# Patient Record
Sex: Female | Born: 1955 | Race: White | Hispanic: No | Marital: Married | Smoking: Never smoker
Health system: Southern US, Community
[De-identification: ages and names within clinical notes are randomized; demographics above are authoritative.]

## PROBLEM LIST (undated history)

## (undated) DIAGNOSIS — K219 Gastro-esophageal reflux disease without esophagitis: Secondary | ICD-10-CM

## (undated) DIAGNOSIS — IMO0002 Reserved for concepts with insufficient information to code with codable children: Secondary | ICD-10-CM

## (undated) DIAGNOSIS — A048 Other specified bacterial intestinal infections: Secondary | ICD-10-CM

## (undated) DIAGNOSIS — K259 Gastric ulcer, unspecified as acute or chronic, without hemorrhage or perforation: Secondary | ICD-10-CM

## (undated) DIAGNOSIS — R7689 Other specified abnormal immunological findings in serum: Secondary | ICD-10-CM

## (undated) DIAGNOSIS — R768 Other specified abnormal immunological findings in serum: Secondary | ICD-10-CM

## (undated) DIAGNOSIS — N200 Calculus of kidney: Secondary | ICD-10-CM

## (undated) DIAGNOSIS — F32A Depression, unspecified: Secondary | ICD-10-CM

## (undated) DIAGNOSIS — D126 Benign neoplasm of colon, unspecified: Secondary | ICD-10-CM

## (undated) DIAGNOSIS — E559 Vitamin D deficiency, unspecified: Secondary | ICD-10-CM

## (undated) DIAGNOSIS — M3500 Sicca syndrome, unspecified: Secondary | ICD-10-CM

## (undated) DIAGNOSIS — F329 Major depressive disorder, single episode, unspecified: Secondary | ICD-10-CM

## (undated) DIAGNOSIS — M8580 Other specified disorders of bone density and structure, unspecified site: Secondary | ICD-10-CM

## (undated) DIAGNOSIS — K7689 Other specified diseases of liver: Secondary | ICD-10-CM

## (undated) HISTORY — PX: TUBAL LIGATION: SHX77

## (undated) HISTORY — DX: Reserved for concepts with insufficient information to code with codable children: IMO0002

## (undated) HISTORY — DX: Other specified disorders of bone density and structure, unspecified site: M85.80

## (undated) HISTORY — DX: Calculus of kidney: N20.0

## (undated) HISTORY — DX: Major depressive disorder, single episode, unspecified: F32.9

## (undated) HISTORY — DX: Gastro-esophageal reflux disease without esophagitis: K21.9

## (undated) HISTORY — PX: UMBILICAL HERNIA REPAIR: SHX2598

## (undated) HISTORY — DX: Other specified abnormal immunological findings in serum: R76.8

## (undated) HISTORY — DX: Other specified abnormal immunological findings in serum: R76.89

## (undated) HISTORY — PX: VAGINAL HYSTERECTOMY: SUR661

## (undated) HISTORY — PX: BREAST EXCISIONAL BIOPSY: SUR124

## (undated) HISTORY — DX: Depression, unspecified: F32.A

## (undated) HISTORY — DX: Sjogren syndrome, unspecified: M35.00

## (undated) HISTORY — PX: DILATION AND CURETTAGE OF UTERUS: SHX78

## (undated) HISTORY — DX: Other specified diseases of liver: K76.89

## (undated) HISTORY — DX: Gastric ulcer, unspecified as acute or chronic, without hemorrhage or perforation: K25.9

## (undated) HISTORY — DX: Other specified bacterial intestinal infections: A04.8

## (undated) HISTORY — DX: Vitamin D deficiency, unspecified: E55.9

## (undated) HISTORY — DX: Benign neoplasm of colon, unspecified: D12.6

## (undated) HISTORY — PX: BREAST SURGERY: SHX581

---

## 1998-06-30 ENCOUNTER — Ambulatory Visit (HOSPITAL_COMMUNITY): Admission: RE | Admit: 1998-06-30 | Discharge: 1998-06-30 | Payer: Self-pay | Admitting: Neurological Surgery

## 1998-06-30 ENCOUNTER — Encounter: Payer: Self-pay | Admitting: Neurological Surgery

## 1998-12-03 ENCOUNTER — Other Ambulatory Visit: Admission: RE | Admit: 1998-12-03 | Discharge: 1998-12-03 | Payer: Self-pay | Admitting: Gynecology

## 2001-03-14 ENCOUNTER — Other Ambulatory Visit: Admission: RE | Admit: 2001-03-14 | Discharge: 2001-03-14 | Payer: Self-pay | Admitting: Gynecology

## 2003-07-25 ENCOUNTER — Other Ambulatory Visit: Admission: RE | Admit: 2003-07-25 | Discharge: 2003-07-25 | Payer: Self-pay | Admitting: Gynecology

## 2006-11-24 ENCOUNTER — Other Ambulatory Visit: Admission: RE | Admit: 2006-11-24 | Discharge: 2006-11-24 | Payer: Self-pay | Admitting: Gynecology

## 2007-02-01 HISTORY — PX: OTHER SURGICAL HISTORY: SHX169

## 2007-08-29 ENCOUNTER — Observation Stay (HOSPITAL_COMMUNITY): Admission: EM | Admit: 2007-08-29 | Discharge: 2007-08-30 | Payer: Self-pay | Admitting: Urology

## 2008-08-11 ENCOUNTER — Ambulatory Visit: Payer: Self-pay | Admitting: Gynecology

## 2008-08-21 ENCOUNTER — Ambulatory Visit: Payer: Self-pay | Admitting: Gynecology

## 2008-08-21 ENCOUNTER — Other Ambulatory Visit: Admission: RE | Admit: 2008-08-21 | Discharge: 2008-08-21 | Payer: Self-pay | Admitting: Gynecology

## 2008-08-21 ENCOUNTER — Encounter: Payer: Self-pay | Admitting: Gynecology

## 2010-06-15 NOTE — Op Note (Signed)
Teresa Christian, Teresa Christian           ACCOUNT NO.:  [id number]   MEDICAL RECORD NO.:  [id number]          PATIENT TYPE:  INP   LOCATION:  1410                         FACILITY:  [facility] [place]   PHYSICIAN:  Sigmund I. Patsi Sears, M.D.DATE OF BIRTH:  [birth year]   DATE OF PROCEDURE:  08/29/2007  DATE OF DISCHARGE:                               OPERATIVE REPORT   PREOPERATIVE DIAGNOSIS:  Impacted right lower ureteral calculus.   POSTOPERATIVE DIAGNOSIS:  Impacted right lower ureteral calculus.   OPERATION:  Cystourethroscopy, right retrograde pyelogram with  interpretation, balloon dilation of right lower ureter, right  ureteroscopy, basket extraction of right lower ureteral calculus, right  double-J stent (6-French x 24 cm).   SURGEON:  Sigmund I. Patsi Sears, M.D.   ANESTHESIA:  General LMA.   PREPARATION:  After appropriate preanesthesia, the patient was brought  to the operating room, placed on the operating room in dorsal supine  position where general LMA anesthesia was induced.  She was then  replaced in dorsal lithotomy position where pubis was prepped with  Betadine solution and draped in usual fashion.   REVIEW OF HISTORY:  The patient is a 55 year old female, with severe  acute onset right flank and right lower quadrant pain for several hours,  with nausea, chills, and but no vomiting.  She has no gross hematuria,  but does have a history of microscopic hematuria.  KUB showed a 3 mm  right lower ureteral calculus.  The patient had kidney stones 15 years  ago and spontaneously passed the stone with no problems.  Note her  history of mixed connective tissue disease as well.  She is now for  basket extraction of stone.  She has required copious amounts of IV pain  medication during the daytime.   PROCEDURE:  Cystourethroscopy accomplished, and a very tight small right  ureteral orifice was identified.  This would admit a 6-French open-ended  catheter and right retrograde pyelogram  was performed, stone was  identified in the right mid to lower ureter.  Hydronephrosis was noted  above the level of stone.  The short ureteroscope was placed in the  bladder, but could not be passed through the orifice into the ureter  without traumatizing the ureter.  Therefore a 4 cm balloon dilator was  used to pass across the ureteral orifice, was dilated for 3 minutes.  Following this, the balloon dilator was removed, and again the  ureteroscope was passed without difficulty into the mid ureter.  The  stone was identified, and basket extracted.  It appeared to be a  multilobulated, smooth, rounded stone.  Stone was taken to the office  and sent for evaluation.  The patient be given photographs of the stone.  Because of balloon dilation and severe spasm of the ureter, it was  elected to place double-J catheter.  A 6-French x 24-cm catheter was  then selected, passed over a guidewire into the kidney under  fluoroscopic table.  The patient tolerated well.  KUB was accomplished  to show that the double-J was in good position.  The patient was given  IV Toradol, awakened and taken recovery room in  good condition.      Sigmund I. Patsi Sears, M.D.  Electronically Signed     SIT/MEDQ  D:  08/29/2007  T:  08/30/2007  Job:  [id number]

## 2011-02-11 DIAGNOSIS — R768 Other specified abnormal immunological findings in serum: Secondary | ICD-10-CM | POA: Insufficient documentation

## 2011-02-11 DIAGNOSIS — IMO0002 Reserved for concepts with insufficient information to code with codable children: Secondary | ICD-10-CM | POA: Insufficient documentation

## 2011-02-11 DIAGNOSIS — N200 Calculus of kidney: Secondary | ICD-10-CM | POA: Insufficient documentation

## 2011-02-11 DIAGNOSIS — M8580 Other specified disorders of bone density and structure, unspecified site: Secondary | ICD-10-CM | POA: Insufficient documentation

## 2011-02-11 DIAGNOSIS — M3500 Sicca syndrome, unspecified: Secondary | ICD-10-CM | POA: Insufficient documentation

## 2011-02-14 ENCOUNTER — Other Ambulatory Visit: Payer: Self-pay | Admitting: Gynecology

## 2011-02-14 DIAGNOSIS — M8580 Other specified disorders of bone density and structure, unspecified site: Secondary | ICD-10-CM

## 2011-02-15 ENCOUNTER — Ambulatory Visit (INDEPENDENT_AMBULATORY_CARE_PROVIDER_SITE_OTHER): Payer: BC Managed Care – PPO

## 2011-02-15 ENCOUNTER — Encounter: Payer: Self-pay | Admitting: Gynecology

## 2011-02-15 ENCOUNTER — Ambulatory Visit (INDEPENDENT_AMBULATORY_CARE_PROVIDER_SITE_OTHER): Payer: BC Managed Care – PPO | Admitting: Gynecology

## 2011-02-15 VITALS — BP 116/64 | Ht 61.5 in | Wt 148.0 lb

## 2011-02-15 DIAGNOSIS — M899 Disorder of bone, unspecified: Secondary | ICD-10-CM

## 2011-02-15 DIAGNOSIS — M8580 Other specified disorders of bone density and structure, unspecified site: Secondary | ICD-10-CM

## 2011-02-15 DIAGNOSIS — Z01419 Encounter for gynecological examination (general) (routine) without abnormal findings: Secondary | ICD-10-CM

## 2011-02-15 DIAGNOSIS — Z79890 Hormone replacement therapy (postmenopausal): Secondary | ICD-10-CM

## 2011-02-15 MED ORDER — ESTRADIOL ACETATE 0.05 MG/24HR VA RING: 0.0500 | VAGINAL_RING | Freq: Once | VAGINAL | Status: DC

## 2011-02-15 NOTE — Patient Instructions (Signed)
Follow-up in one year.

## 2011-02-15 NOTE — Progress Notes (Signed)
Teresa Christian [birth year] [id number]        56 y.o.  for annual exam.  Doing well status post LAVH using Femring 0.05 mg.  Past medical history,surgical history, medications, allergies, family history and social history were all reviewed and documented in the EPIC chart. ROS:  Was performed and pertinent positives and negatives are included in the history.  Exam: chaperone present Filed Vitals:   02/15/11 0939  BP: 116/64   General appearance  Normal Skin grossly normal Head/Neck normal with no cervical or supraclavicular adenopathy thyroid normal Lungs  clear Cardiac RR, without RMG Abdominal  soft, nontender, without masses, organomegaly or hernia Breasts  examined lying and sitting without masses, retractions, discharge or axillary adenopathy. Pelvic  Ext/BUS/vagina  normal   Adnexa  Without masses or tenderness    Anus and perineum  normal   Rectovaginal  normal sphincter tone without palpated masses or tenderness.    Assessment/Plan:  56 y.o. female for annual exam.    1. ERT.  Patient using Femring 0.05 mg doing well with this wants to continue and I refilled her times a year. We have discussed on multiple occasions ERT and the risks to include the WHI study increased risk of stroke, heart attack, DVT and breast cancer risks. 2. Osteopenia. DEXA today shows osteopenia with a -2.0. In comparison to her prior studies there is no statistically significant change. FRAX shows 10 year fracture risk of major osteoporotic 8.5% and hip fracture of 1%.  She is off of Actonel. She'll continue with observation we'll plan on repeating DEXA in another several years. 3. Pap smear. Last Pap smear was 2010. She has multiple normals in her chart. She is status post LAVH for benign indications. Current screening guidelines were reviewed and she is comfortable with stopping Pap smears and no Pap smear was done today. 4. Mammography. Last mammography was April 2012 where she had a diagnostic  mammography and ultrasound because she had felt something behind her right nipple. Mammography was negative and ultrasound showed a small simple cyst. She notes what she felt before has disappeared and her breast exam today is normal. She'll continue with her mammography with repeat in April and with SBE monthly. 5. Colonoscopy. She had colonoscopy at age 45 and said she is due now she will call Dr. Loreta Ave to arrange. 6. Health maintenance.  She sees a rheumatologist at [facility] for her Sjogren's and Dr. Waynard Edwards for routine health maintenance and as he has an appointment to see him this coming month. No blood work was done a cystoscopy done through her other 2 physicians offices. Assuming she continues well from a gynecologic standpoint and she will see me in a year, sooner as needed    Dara Lords MD, 10:10 AM 02/15/2011

## 2011-05-31 ENCOUNTER — Other Ambulatory Visit: Payer: Self-pay | Admitting: Gastroenterology

## 2011-05-31 DIAGNOSIS — R1013 Epigastric pain: Secondary | ICD-10-CM

## 2011-05-31 DIAGNOSIS — R110 Nausea: Secondary | ICD-10-CM

## 2011-06-14 ENCOUNTER — Encounter (HOSPITAL_COMMUNITY)
Admission: RE | Admit: 2011-06-14 | Discharge: 2011-06-14 | Disposition: A | Discharge status: Home / Self Care | Payer: BC Managed Care – PPO | Source: Ambulatory Visit | Attending: Gastroenterology | Admitting: Gastroenterology

## 2011-06-14 DIAGNOSIS — R1013 Epigastric pain: Secondary | ICD-10-CM | POA: Insufficient documentation

## 2011-06-14 DIAGNOSIS — R110 Nausea: Secondary | ICD-10-CM | POA: Insufficient documentation

## 2011-06-14 MED ORDER — TECHNETIUM TC 99M MEBROFENIN IV KIT
5.5000 | PACK | Freq: Once | INTRAVENOUS | Status: AC | PRN
  Administered 2011-06-14: 6 via INTRAVENOUS

## 2011-07-02 DIAGNOSIS — D126 Benign neoplasm of colon, unspecified: Secondary | ICD-10-CM

## 2011-07-02 DIAGNOSIS — A048 Other specified bacterial intestinal infections: Secondary | ICD-10-CM

## 2011-07-02 DIAGNOSIS — K259 Gastric ulcer, unspecified as acute or chronic, without hemorrhage or perforation: Secondary | ICD-10-CM

## 2011-07-02 HISTORY — DX: Other specified bacterial intestinal infections: A04.8

## 2011-07-02 HISTORY — DX: Gastric ulcer, unspecified as acute or chronic, without hemorrhage or perforation: K25.9

## 2011-07-02 HISTORY — DX: Benign neoplasm of colon, unspecified: D12.6

## 2011-07-05 ENCOUNTER — Other Ambulatory Visit: Payer: Self-pay | Admitting: Gynecology

## 2011-07-13 ENCOUNTER — Other Ambulatory Visit: Payer: Self-pay | Admitting: Gynecology

## 2011-07-13 MED ORDER — FLUCONAZOLE 150 MG PO TABS: 150.0000 mg | ORAL_TABLET | Freq: Once | ORAL | Status: AC

## 2011-07-13 NOTE — Progress Notes (Signed)
On antibiotics for H pylori with yeast infection.  Diflucan 150 mg # 5 prn as she will be an an extended course of oral antibiotics

## 2011-09-28 ENCOUNTER — Encounter: Payer: Self-pay | Admitting: Gynecology

## 2012-03-27 ENCOUNTER — Other Ambulatory Visit: Payer: Self-pay | Admitting: Gynecology

## 2012-04-16 ENCOUNTER — Other Ambulatory Visit: Payer: Self-pay | Admitting: Gynecology

## 2012-04-16 MED ORDER — FLUCONAZOLE 150 MG PO TABS: 150.0000 mg | ORAL_TABLET | Freq: Once | ORAL | Status: DC

## 2012-07-01 DIAGNOSIS — M8580 Other specified disorders of bone density and structure, unspecified site: Secondary | ICD-10-CM

## 2012-07-01 HISTORY — DX: Other specified disorders of bone density and structure, unspecified site: M85.80

## 2012-07-16 ENCOUNTER — Encounter: Payer: Self-pay | Admitting: Gynecology

## 2012-07-16 ENCOUNTER — Ambulatory Visit (INDEPENDENT_AMBULATORY_CARE_PROVIDER_SITE_OTHER): Payer: BC Managed Care – PPO | Admitting: Gynecology

## 2012-07-16 VITALS — BP 120/76 | Ht 61.0 in | Wt 146.0 lb

## 2012-07-16 DIAGNOSIS — R102 Pelvic and perineal pain: Secondary | ICD-10-CM

## 2012-07-16 DIAGNOSIS — N949 Unspecified condition associated with female genital organs and menstrual cycle: Secondary | ICD-10-CM

## 2012-07-16 DIAGNOSIS — Z79890 Hormone replacement therapy (postmenopausal): Secondary | ICD-10-CM

## 2012-07-16 DIAGNOSIS — Z01419 Encounter for gynecological examination (general) (routine) without abnormal findings: Secondary | ICD-10-CM

## 2012-07-16 MED ORDER — ESTRADIOL ACETATE 0.05 MG/24HR VA RING: VAGINAL_RING | VAGINAL | Status: DC

## 2012-07-16 NOTE — Progress Notes (Signed)
Teresa Christian 1955-06-06 [id number]        57 y.o.  [id number] for annual exam.  Overall doing well. Several issues noted below.  Past medical history,surgical history, medications, allergies, family history and social history were all reviewed and documented in the EPIC chart.  ROS:  Performed and pertinent positives and negatives are included in the history, assessment and plan .  Exam:  Filed Vitals:   07/16/12 1021  BP: 120/76  Height: 5\' 1"  (1.549 m)  Weight: 146 lb (66.225 kg)   General appearance  Normal Skin grossly normal Head/Neck normal with no cervical or supraclavicular adenopathy thyroid normal Lungs  clear Cardiac RR, without RMG Abdominal  soft, nontender, without masses, organomegaly or hernia Breasts  examined lying and sitting without masses, retractions, discharge or axillary adenopathy. Pelvic  Ext/BUS/vagina  normal   Adnexa  Without masses or tenderness    Anus and perineum  normal   Rectovaginal  normal sphincter tone without palpated masses or tenderness.    Assessment/Plan:  57 y.o. G4W1027 female for annual exam, status post TVH.   1. HRT. Patient continues on Femring 0.05 mg. Doing well with this. She notes when she is over due for changing her ring she has hot flushes, sweats, mood instability and overall not feeling well. We again discussed the risks to include stroke heart attack DVT and breast cancer. I refilled her Femring 0.05x1 year. 2. Osteopenia. Just had BX at that Dr. Laurey Morale office earlier this year. Reports a T score of -1.5. She will continue to followup with him in reference to this. 3. Lower abdominal bloating. Comes and goes. Regular bowel movements without constipation/diarrhea. Exam is normal. Start with ultrasound rule out nonpalpable abnormalities. 4. Colonoscopy last year with removal of adenomatous polyps x2. Also with endoscopy which showed H. pylori. Repeat at their recommended interval. 5. Pap smear 2010. No Pap smear done  today. Previously discussed current screening guidelines we decided to stop screening. 6. Mammography 09/2011. Continued annual mammography. SBE monthly reviewed. 7. Sjogren's. Continue followup with her rheumatologist. 8. Health maintenance. No lab work done as it is all done through Dr. Laurey Morale office. Followup for ultrasound, otherwise one year, sooner as needed.    Dara Lords MD, 10:50 AM 07/16/2012

## 2012-07-16 NOTE — Patient Instructions (Signed)
Follow up for ultrasound as scheduled 

## 2012-07-30 ENCOUNTER — Ambulatory Visit (INDEPENDENT_AMBULATORY_CARE_PROVIDER_SITE_OTHER): Payer: BC Managed Care – PPO | Admitting: Gynecology

## 2012-07-30 ENCOUNTER — Ambulatory Visit (INDEPENDENT_AMBULATORY_CARE_PROVIDER_SITE_OTHER): Payer: BC Managed Care – PPO

## 2012-07-30 ENCOUNTER — Encounter: Payer: Self-pay | Admitting: Gynecology

## 2012-07-30 DIAGNOSIS — R141 Gas pain: Secondary | ICD-10-CM

## 2012-07-30 DIAGNOSIS — R140 Abdominal distension (gaseous): Secondary | ICD-10-CM

## 2012-07-30 DIAGNOSIS — R102 Pelvic and perineal pain: Secondary | ICD-10-CM

## 2012-07-30 DIAGNOSIS — N949 Unspecified condition associated with female genital organs and menstrual cycle: Secondary | ICD-10-CM

## 2012-07-30 DIAGNOSIS — N83339 Acquired atrophy of ovary and fallopian tube, unspecified side: Secondary | ICD-10-CM

## 2012-07-30 NOTE — Progress Notes (Signed)
Patient follows up for ultrasound.  Ultrasound status post hysterectomy. Right and left ovaries visualized, small consistent with postmenopausal. Cul-de-sac negative. Excessive bowel shadowing noted.  Results reviewed with patient via text. Recommend followup with GI if bloating continues to be an issue.

## 2012-07-30 NOTE — Patient Instructions (Signed)
Followup with GI if bloating continues to be an issue.

## 2013-01-30 ENCOUNTER — Telehealth: Payer: Self-pay | Admitting: Gynecology

## 2013-01-30 ENCOUNTER — Other Ambulatory Visit: Payer: Self-pay | Admitting: Gynecology

## 2013-01-30 MED ORDER — HYDROXYZINE HCL 25 MG PO TABS: 25.0000 mg | ORAL_TABLET | Freq: Three times a day (TID) | ORAL | Status: DC | PRN

## 2013-01-30 NOTE — Telephone Encounter (Signed)
Allergic reaction with rash and itching thought to be secondary to Plaquenil. Vistaril 25 mg #30 3 times a day when necessary itching 1 refill

## 2013-04-04 ENCOUNTER — Other Ambulatory Visit: Payer: Self-pay | Admitting: Gynecology

## 2013-04-04 DIAGNOSIS — Z1231 Encounter for screening mammogram for malignant neoplasm of breast: Secondary | ICD-10-CM

## 2013-04-23 ENCOUNTER — Ambulatory Visit
Admission: RE | Admit: 2013-04-23 | Discharge: 2013-04-23 | Disposition: A | Discharge status: Home / Self Care | Payer: Self-pay | Source: Ambulatory Visit | Attending: Gynecology | Admitting: Gynecology

## 2013-04-23 DIAGNOSIS — Z1231 Encounter for screening mammogram for malignant neoplasm of breast: Secondary | ICD-10-CM

## 2013-04-25 ENCOUNTER — Other Ambulatory Visit: Payer: Self-pay | Admitting: Gynecology

## 2013-04-25 DIAGNOSIS — R928 Other abnormal and inconclusive findings on diagnostic imaging of breast: Secondary | ICD-10-CM

## 2013-04-26 ENCOUNTER — Ambulatory Visit
Admission: RE | Admit: 2013-04-26 | Discharge: 2013-04-26 | Disposition: A | Discharge status: Home / Self Care | Payer: BC Managed Care – PPO | Source: Ambulatory Visit | Attending: Gynecology | Admitting: Gynecology

## 2013-04-26 DIAGNOSIS — R928 Other abnormal and inconclusive findings on diagnostic imaging of breast: Secondary | ICD-10-CM

## 2013-05-01 DIAGNOSIS — E559 Vitamin D deficiency, unspecified: Secondary | ICD-10-CM

## 2013-05-01 HISTORY — DX: Vitamin D deficiency, unspecified: E55.9

## 2013-05-14 ENCOUNTER — Encounter: Payer: Self-pay | Admitting: Gynecology

## 2013-05-14 ENCOUNTER — Other Ambulatory Visit: Payer: Self-pay | Admitting: Gynecology

## 2013-05-14 MED ORDER — VITAMIN D (ERGOCALCIFEROL) 1.25 MG (50000 UNIT) PO CAPS: 50000.0000 [IU] | ORAL_CAPSULE | ORAL | Status: DC

## 2013-05-15 ENCOUNTER — Encounter: Payer: Self-pay | Admitting: Gynecology

## 2013-05-22 IMAGING — US US ABDOMEN COMPLETE
1 series · 14 of 25 positions shown · non-contrast
Comparison: CT evidence of / 13/2383.

CLINICAL DATA: Nausea.  Epigastric pain.

COMPLETE ABDOMINAL ULTRASOUND

[Series 1: us abdomen complete · 0.28mm/px · 14 of 74 slices shown]
[im 1/74]
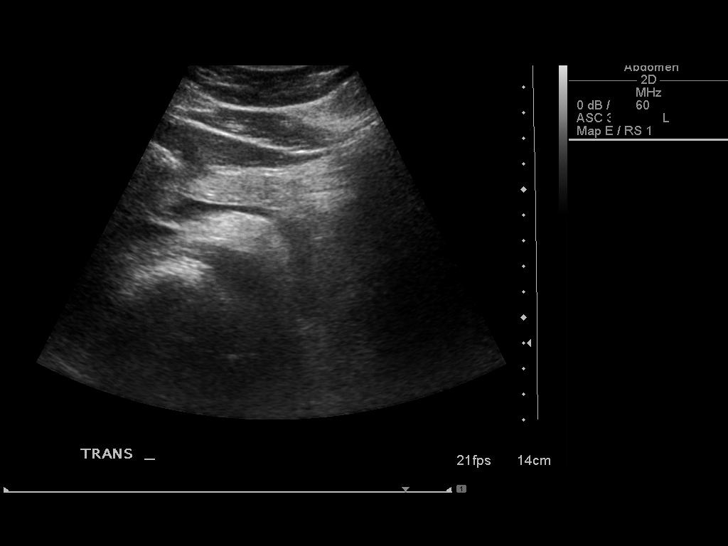
[im 7/74]
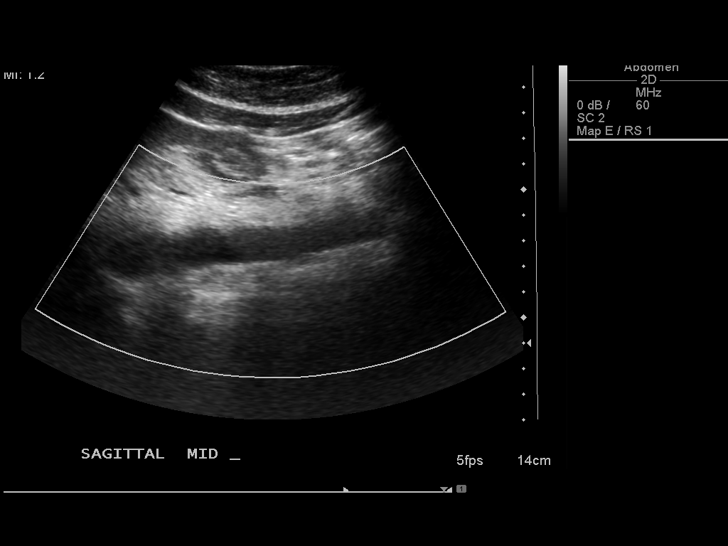
[im 13/74]
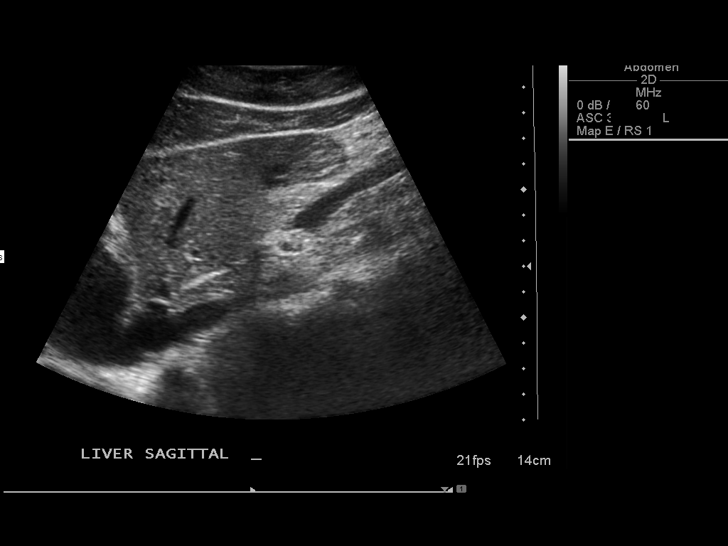
[im 19/74]
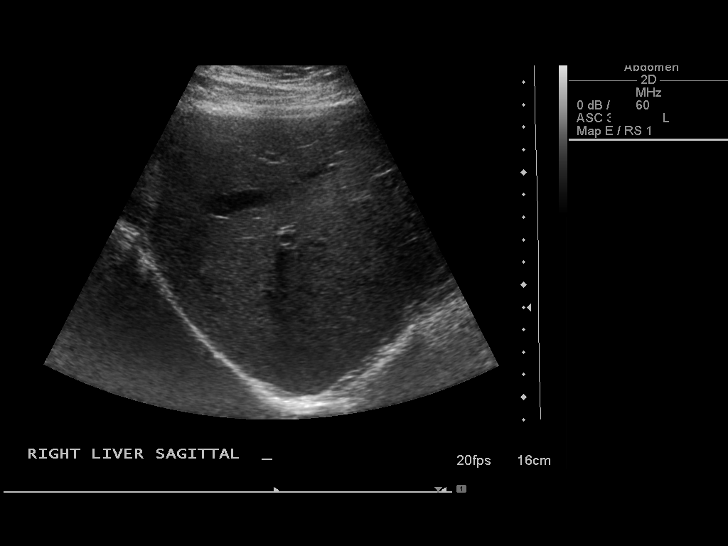
[im 25/74]
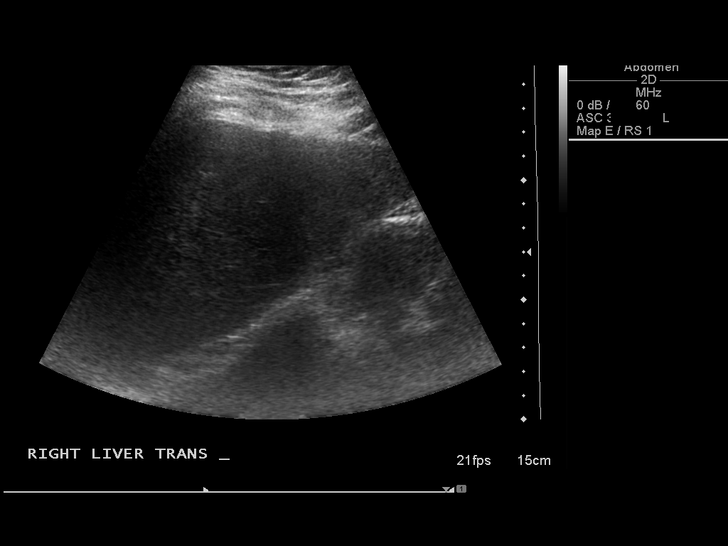
[im 28/74]
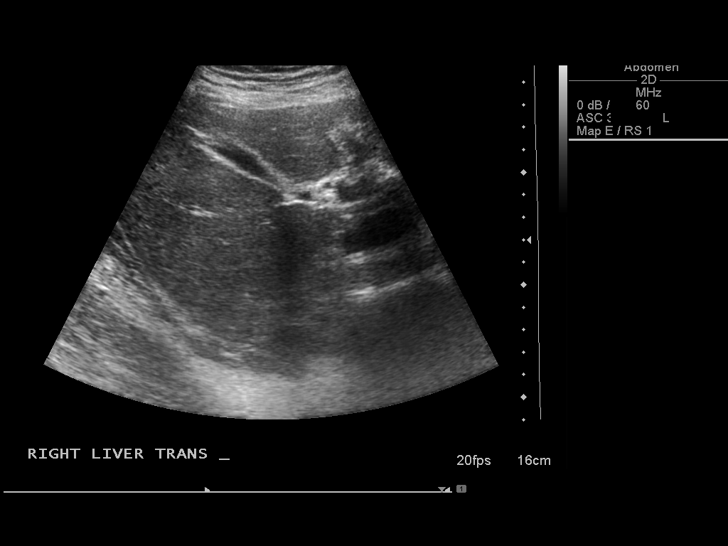
[im 34/74]
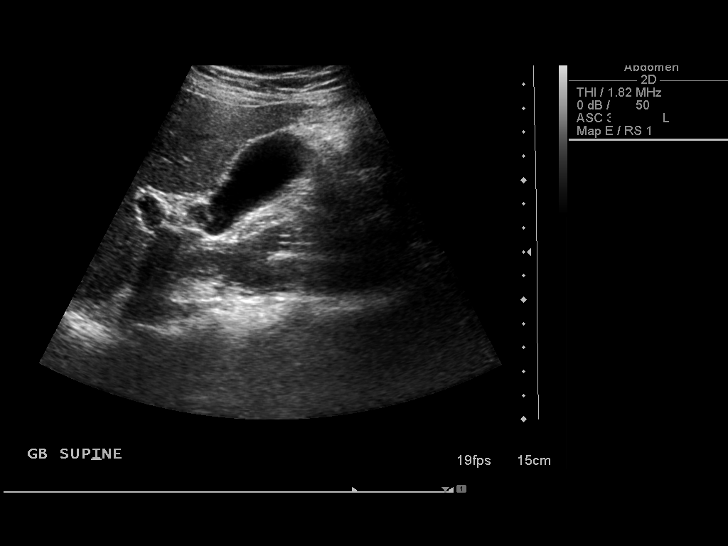
[im 40/74]
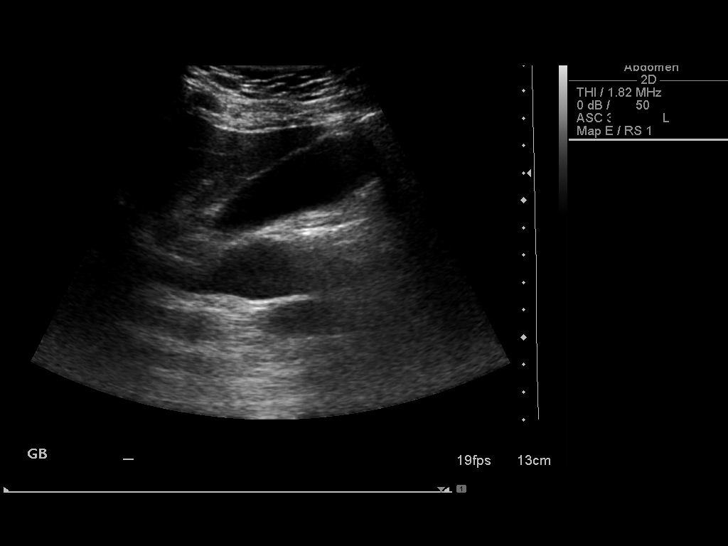
[im 46/74]
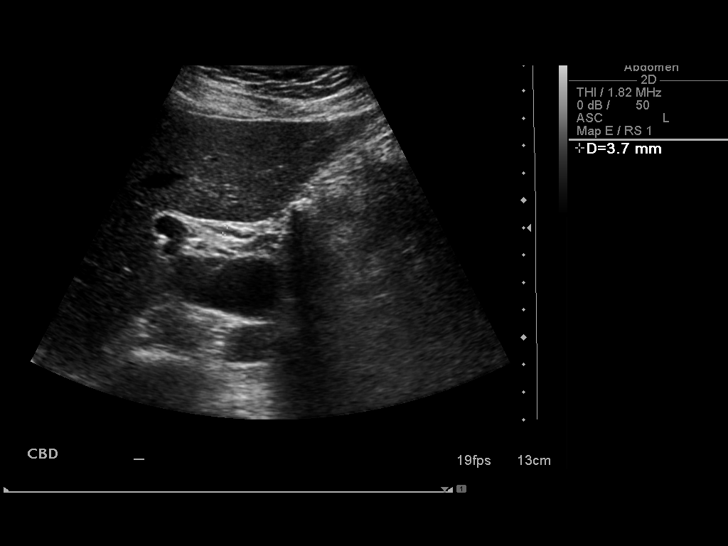
[im 49/74]
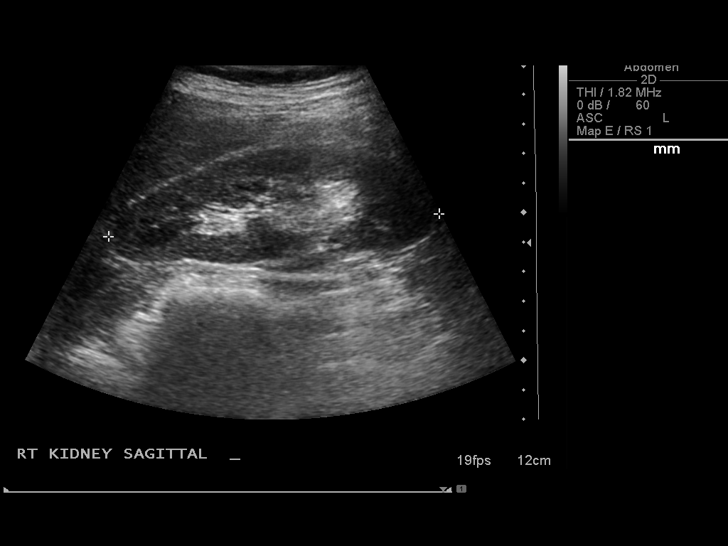
[im 55/74]
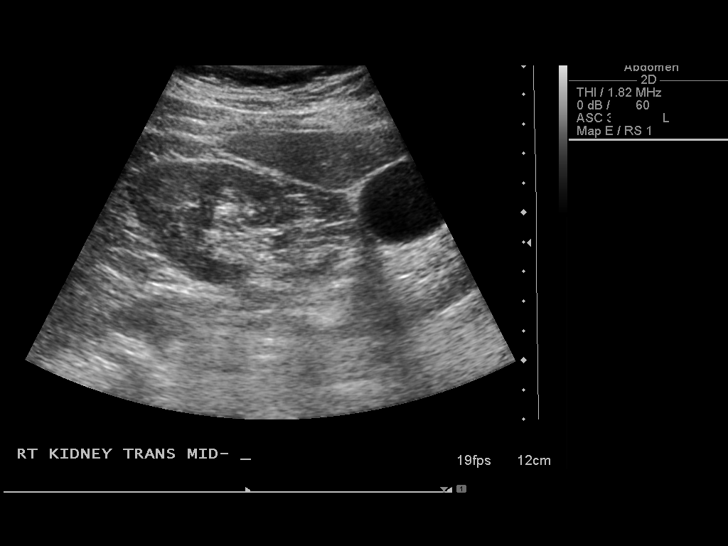
[im 61/74]
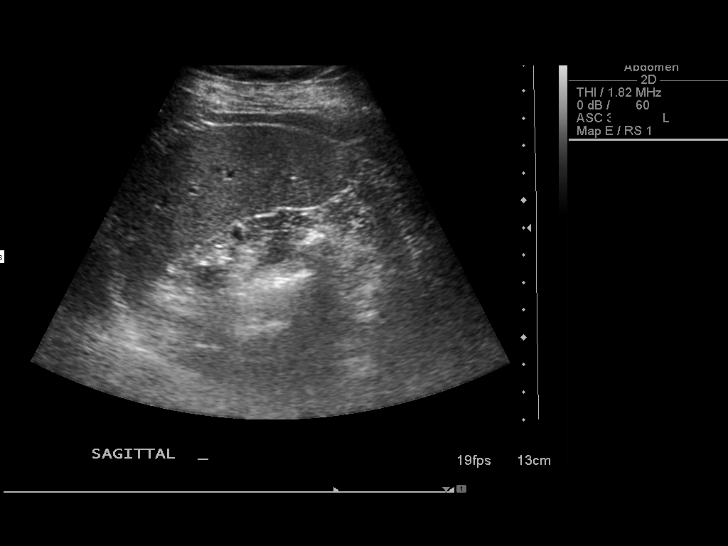
[im 67/74]
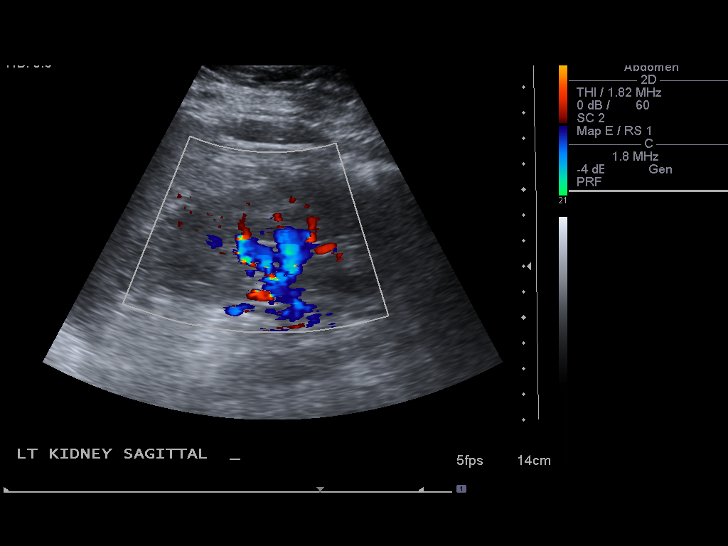
[im 74/74]
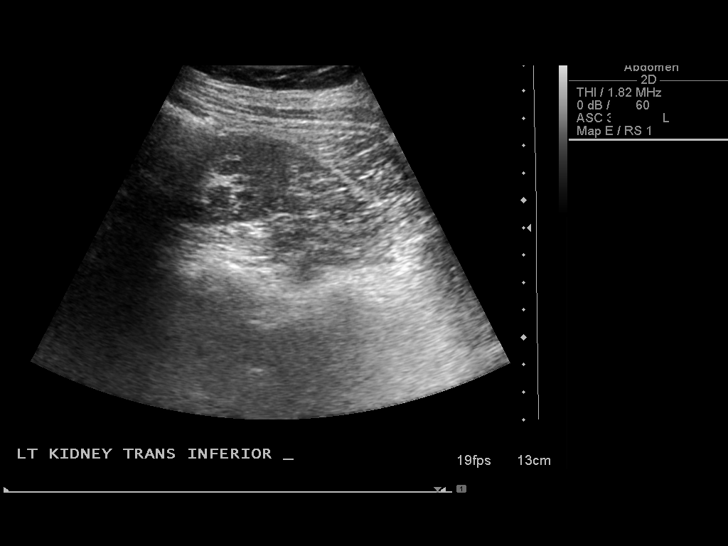

[14 of 25 positions shown; findings below may reference images not displayed]

FINDINGS: Gallbladder:  No gallstones, gallbladder wall thickening, or
pericholecystic fluid.

Common bile duct:  4 mm, normal.

Liver:  Normal echotexture aside from a mm cyst in the left hepatic
lobe.  This compatible with a benign simple cyst.  Increased
through transmission is present.  No mass lesions.  The liver is
isoechoic when compared to the adjacent right kidney.

IVC:  Appears normal.

Pancreas:  No focal abnormality seen.

Spleen:  7.8 cm.  Normal echotexture.

Right Kidney:  11.2 cm. Normal echotexture.  Normal central sinus
echo complex.  No calculi or hydronephrosis.

Left Kidney:  11.6 cm. Normal echotexture.  Normal central sinus
echo complex.  No calculi or hydronephrosis.

Abdominal aorta:  No aneurysm identified.
IMPRESSION: 1.  Negative for cholelithiasis or cholecystitis.
2.  8 mm simple cyst in the left hepatic lobe.

## 2013-06-07 ENCOUNTER — Encounter: Payer: Self-pay | Admitting: *Deleted

## 2013-06-11 ENCOUNTER — Ambulatory Visit: Payer: BC Managed Care – PPO | Admitting: Internal Medicine

## 2013-06-13 ENCOUNTER — Other Ambulatory Visit (INDEPENDENT_AMBULATORY_CARE_PROVIDER_SITE_OTHER): Payer: BC Managed Care – PPO

## 2013-06-13 ENCOUNTER — Encounter: Payer: Self-pay | Admitting: Internal Medicine

## 2013-06-13 ENCOUNTER — Ambulatory Visit (INDEPENDENT_AMBULATORY_CARE_PROVIDER_SITE_OTHER): Payer: BC Managed Care – PPO | Admitting: Internal Medicine

## 2013-06-13 VITALS — BP 90/60 | HR 84 | Ht 61.0 in | Wt 155.5 lb

## 2013-06-13 DIAGNOSIS — B9681 Helicobacter pylori [H. pylori] as the cause of diseases classified elsewhere: Secondary | ICD-10-CM

## 2013-06-13 DIAGNOSIS — K2970 Gastritis, unspecified, without bleeding: Principal | ICD-10-CM

## 2013-06-13 DIAGNOSIS — A048 Other specified bacterial intestinal infections: Secondary | ICD-10-CM

## 2013-06-13 DIAGNOSIS — K2940 Chronic atrophic gastritis without bleeding: Secondary | ICD-10-CM

## 2013-06-13 DIAGNOSIS — R197 Diarrhea, unspecified: Secondary | ICD-10-CM

## 2013-06-13 LAB — IGA: IgA: 243 mg/dL (ref 68–378)

## 2013-06-13 MED ORDER — OMEPRAZOLE 20 MG PO CPDR: 20.0000 mg | DELAYED_RELEASE_CAPSULE | Freq: Every day | ORAL | Status: DC

## 2013-06-13 MED ORDER — HYOSCYAMINE SULFATE ER 0.375 MG PO TB12: 0.3750 mg | ORAL_TABLET | Freq: Two times a day (BID) | ORAL | Status: DC

## 2013-06-13 NOTE — Patient Instructions (Addendum)
Your physician has requested that you go to the basement for the following lab work before leaving today: H pylori stool test, Celiac 10 Panel, IgA  We have sent the following medications to your pharmacy for you to pick up at your convenience: Omeprazole 20 mg once daily Levbid 0.375 mg twice daily  CC:Dr Perini, Dr Wayne Both

## 2013-06-13 NOTE — Progress Notes (Signed)
KAMBRY TAKACS January 05, 1956 [id number]  Note: This dictation was prepared with Dragon digital system. Any transcriptional errors that result from this procedure are unintentional.   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 She is the wife of Dr. Lamar Blinks. She comes seeking a second opinion of digestive problems of several years evaluated by Dr Collene Mares in 2013 which temporarily subsided but returned several months ago. She describes dyspepsia, early satiety and some heartburn as well as changes in bowel habits to rather urgent and soft stools which occur mostly in the mornings and are difficult to control. She denies any visible blood per rectum. There is no family history of inflammatory bowel disease. She has been exercising on a regular basis. She is also on a healthy diet but she admits to having lactose intolerance. She had a biliary evaluation in 2013 which included a normal abdominal ultrasound and HIDA scan with an ejection fraction of 94%. she has also had 2 screening colonoscopies, one at age 1 and one in 2013 which showed a tubular adenoma in the descending colon with no evidence of high-grade dysplasia. An upper endoscopy showed a small antral ulcer which was H. pylori positive. She was treated with Pylera which resulted in complete resolution of the dyspepsia and upper GI symptoms. Her most recent comprehensive blood tests by Dr Joylene Draft showed mild elevation of  ALT at 42. She has been diagnosed with Sjogren's syndrome and has been followed for at least 13 years at [facility]. She was on Plaquenil for 13 years until last year when she was diagnosed with mild peripheral neuropathy and she has discontinued it on her own. She has dry mouth and dry eyes. She is not taking any anti-inflammatory agents because of  history of  Antral ulcer.   Past Medical History  Diagnosis Date  . Herniated disc     C4, C5  . ANA positive    Sjogren's  . Osteopenia 07/2012    tscore -1.5. FRAX 4.1%/0.3%  . Renal lithiasis   . Sjoegren syndrome   . Vitamin D deficiency 05/2013    Value 20 05/2013  . Adenomatous colon polyp 07/2011    Dr Collene Mares  . Antral ulcer 07/2011    Dr Collene Mares  . Helicobacter pylori (H. pylori) 07/2011    Dr Collene Mares  . Hepatic cyst   . Depression   . GERD (gastroesophageal reflux disease)     Past Surgical History  Procedure Laterality Date  . Dilation and curettage of uterus      mole  . Tubal ligation    . Vaginal hysterectomy      LAVH  . Umbilical hernia repair    . Kidney stint Right 2009  . Breast surgery Left     Fibroadenoma left breast    Allergies  Allergen Reactions  . Sulfa Antibiotics   . Valium [Diazepam]     Family history and social history have been reviewed.  Review of Systems: Negative for dysphagia. Positive for heartburn early satiety bloating and change in bowel habits  The remainder of the 10 point ROS is negative except as outlined in the H&P  Physical Exam: General Appearance Well developed, in no distress Eyes  Non icteric  HEENT  Non traumatic, normocephalic  Mouth No lesion, tongue papillated, no cheilosis Neck Supple without adenopathy, thyroid not enlarged, no carotid bruits, no JVD Lungs Clear to auscultation bilaterally COR Normal S1, normal S2, regular rhythm,  no murmur, quiet precordium Abdomen soft with epigastric tenderness in the midline. No rebound. Normal active bowel sounds. No tympany. Lower quadrants unremarkable Rectal soft Hemoccult negative stool. Normal rectal sphincter tone Extremities  No pedal edema Skin No lesions Neurological Alert and oriented x 3 Psychological Normal mood and affect  Assessment and Plan:   Problem #65 58 year old female with upper as well as lower GI dysfunction and history of H.pylori gastropathy. A possibility would be recurrent H. pylori gastritis. Pylera is close to 90% effective. We will check this with a stool  antigen test. We will also start her empirically on omeprazole 20 mg daily.in case we are dealing with a non ulcer dyspepsia. Other possibilities include an autoimmune disease such as celiac sprue or microscopic colitis which may be associated with Sjogren's syndrome. We will check a sprue profile today as well as IgA levels. The symptoms of fecal urgency are more suggestive of an irritable bowel syndrome. There was no diverticulosis mentioned  on the last colonoscopy by Dr. Collene Mares so I feel that we are most likely dealing with either IBS or microscopic colitis. We will treat her symptomatically for now with Levbid 0.375 mg twice a day at 8 PM and 8 AM. We are going to hold off on repeating biliary studieswhich were normal in 2013.Marland Kitchen Abnormal transaminases may have to be repeated to check for fatty liver. Depending on the results of the blood tests and response to treatment, we will decide about further disposition.    [place] Dragon 06/13/2013

## 2013-06-14 LAB — CELIAC PANEL 10
ENDOMYSIAL SCREEN: NEGATIVE
GLIADIN IGG: 4.8 U/mL (ref <20)
Gliadin IgA: 18.3 U/mL (ref <20)
IgA: 256 mg/dL (ref 69–380)
Tissue Transglut Ab: 5.4 U/mL (ref <20)
Tissue Transglutaminase Ab, IgA: 3.3 U/mL (ref <20)

## 2013-06-16 LAB — H. PYLORI ANTIGEN, STOOL: H PYLORI AG STL: NEGATIVE

## 2013-06-24 ENCOUNTER — Encounter: Payer: Self-pay | Admitting: Internal Medicine

## 2013-12-02 ENCOUNTER — Encounter: Payer: Self-pay | Admitting: Internal Medicine

## 2014-03-26 ENCOUNTER — Other Ambulatory Visit: Payer: Self-pay

## 2014-03-26 DIAGNOSIS — Z1231 Encounter for screening mammogram for malignant neoplasm of breast: Secondary | ICD-10-CM

## 2014-04-25 ENCOUNTER — Ambulatory Visit: Payer: BC Managed Care – PPO

## 2014-04-29 ENCOUNTER — Ambulatory Visit
Admission: RE | Admit: 2014-04-29 | Discharge: 2014-04-29 | Disposition: A | Discharge status: Home / Self Care | Payer: BC Managed Care – PPO | Source: Ambulatory Visit

## 2014-04-29 DIAGNOSIS — Z1231 Encounter for screening mammogram for malignant neoplasm of breast: Secondary | ICD-10-CM

## 2014-10-15 ENCOUNTER — Ambulatory Visit (INDEPENDENT_AMBULATORY_CARE_PROVIDER_SITE_OTHER): Payer: BC Managed Care – PPO | Admitting: Anesthesiology

## 2014-10-15 DIAGNOSIS — Z23 Encounter for immunization: Secondary | ICD-10-CM | POA: Diagnosis not present

## 2014-12-22 ENCOUNTER — Other Ambulatory Visit: Payer: Self-pay | Admitting: Gynecology

## 2014-12-22 ENCOUNTER — Ambulatory Visit: Payer: BC Managed Care – PPO | Admitting: Gynecology

## 2014-12-22 DIAGNOSIS — N3000 Acute cystitis without hematuria: Secondary | ICD-10-CM

## 2014-12-22 MED ORDER — NITROFURANTOIN MONOHYD MACRO 100 MG PO CAPS: 100.0000 mg | ORAL_CAPSULE | Freq: Two times a day (BID) | ORAL | Status: DC

## 2015-03-03 ENCOUNTER — Other Ambulatory Visit: Payer: Self-pay | Admitting: Orthopedic Surgery

## 2015-03-04 ENCOUNTER — Encounter (HOSPITAL_BASED_OUTPATIENT_CLINIC_OR_DEPARTMENT_OTHER): Payer: Self-pay | Admitting: *Deleted

## 2015-03-10 ENCOUNTER — Ambulatory Visit (HOSPITAL_BASED_OUTPATIENT_CLINIC_OR_DEPARTMENT_OTHER): Payer: BC Managed Care – PPO | Admitting: Anesthesiology

## 2015-03-10 ENCOUNTER — Encounter (HOSPITAL_BASED_OUTPATIENT_CLINIC_OR_DEPARTMENT_OTHER): Payer: Self-pay | Admitting: *Deleted

## 2015-03-10 ENCOUNTER — Ambulatory Visit (HOSPITAL_BASED_OUTPATIENT_CLINIC_OR_DEPARTMENT_OTHER)
Admission: RE | Admit: 2015-03-10 | Discharge: 2015-03-10 | Disposition: A | Discharge status: Home / Self Care | Payer: BC Managed Care – PPO | Source: Ambulatory Visit | Attending: Orthopedic Surgery | Admitting: Orthopedic Surgery

## 2015-03-10 ENCOUNTER — Encounter (HOSPITAL_BASED_OUTPATIENT_CLINIC_OR_DEPARTMENT_OTHER)
Admission: RE | Disposition: A | Discharge status: Home / Self Care | Payer: Self-pay | Source: Ambulatory Visit | Attending: Orthopedic Surgery

## 2015-03-10 DIAGNOSIS — Z9851 Tubal ligation status: Secondary | ICD-10-CM | POA: Insufficient documentation

## 2015-03-10 DIAGNOSIS — Z86010 Personal history of colonic polyps: Secondary | ICD-10-CM | POA: Insufficient documentation

## 2015-03-10 DIAGNOSIS — M8580 Other specified disorders of bone density and structure, unspecified site: Secondary | ICD-10-CM | POA: Insufficient documentation

## 2015-03-10 DIAGNOSIS — M65841 Other synovitis and tenosynovitis, right hand: Secondary | ICD-10-CM | POA: Insufficient documentation

## 2015-03-10 DIAGNOSIS — Z888 Allergy status to other drugs, medicaments and biological substances status: Secondary | ICD-10-CM | POA: Insufficient documentation

## 2015-03-10 DIAGNOSIS — F329 Major depressive disorder, single episode, unspecified: Secondary | ICD-10-CM | POA: Diagnosis not present

## 2015-03-10 DIAGNOSIS — Z87442 Personal history of urinary calculi: Secondary | ICD-10-CM | POA: Insufficient documentation

## 2015-03-10 DIAGNOSIS — M65311 Trigger thumb, right thumb: Secondary | ICD-10-CM | POA: Insufficient documentation

## 2015-03-10 DIAGNOSIS — Z79899 Other long term (current) drug therapy: Secondary | ICD-10-CM | POA: Insufficient documentation

## 2015-03-10 DIAGNOSIS — M3500 Sicca syndrome, unspecified: Secondary | ICD-10-CM | POA: Insufficient documentation

## 2015-03-10 DIAGNOSIS — Z90710 Acquired absence of both cervix and uterus: Secondary | ICD-10-CM | POA: Insufficient documentation

## 2015-03-10 DIAGNOSIS — K219 Gastro-esophageal reflux disease without esophagitis: Secondary | ICD-10-CM | POA: Diagnosis not present

## 2015-03-10 DIAGNOSIS — Z882 Allergy status to sulfonamides status: Secondary | ICD-10-CM | POA: Insufficient documentation

## 2015-03-10 DIAGNOSIS — M50221 Other cervical disc displacement at C4-C5 level: Secondary | ICD-10-CM | POA: Diagnosis not present

## 2015-03-10 HISTORY — PX: TRIGGER FINGER RELEASE: SHX641

## 2015-03-10 SURGERY — RELEASE, A1 PULLEY, FOR TRIGGER FINGER
Anesthesia: Monitor Anesthesia Care | Site: Thumb | Laterality: Right

## 2015-03-10 MED ORDER — HYDROCODONE-ACETAMINOPHEN 5-325 MG PO TABS: 1.0000 | ORAL_TABLET | Freq: Four times a day (QID) | ORAL | Status: DC | PRN

## 2015-03-10 MED ORDER — MIDAZOLAM HCL 2 MG/2ML IJ SOLN
INTRAMUSCULAR | Status: AC
  Filled 2015-03-10: qty 2

## 2015-03-10 MED ORDER — PROPOFOL 10 MG/ML IV BOLUS
INTRAVENOUS | Status: AC
  Filled 2015-03-10: qty 40

## 2015-03-10 MED ORDER — CHLORHEXIDINE GLUCONATE 4 % EX LIQD: 60.0000 mL | Freq: Once | CUTANEOUS | Status: DC

## 2015-03-10 MED ORDER — LACTATED RINGERS IV SOLN
INTRAVENOUS | Status: DC
  Administered 2015-03-10: 10:00:00 via INTRAVENOUS

## 2015-03-10 MED ORDER — MIDAZOLAM HCL 2 MG/2ML IJ SOLN: 1.0000 mg | INTRAMUSCULAR | Status: DC | PRN

## 2015-03-10 MED ORDER — ONDANSETRON HCL 4 MG/2ML IJ SOLN
INTRAMUSCULAR | Status: AC
  Filled 2015-03-10: qty 2

## 2015-03-10 MED ORDER — FENTANYL CITRATE (PF) 100 MCG/2ML IJ SOLN
50.0000 ug | INTRAMUSCULAR | Status: DC | PRN
  Administered 2015-03-10: 100 ug via INTRAVENOUS

## 2015-03-10 MED ORDER — FENTANYL CITRATE (PF) 100 MCG/2ML IJ SOLN
INTRAMUSCULAR | Status: AC
  Filled 2015-03-10: qty 2

## 2015-03-10 MED ORDER — LIDOCAINE HCL (CARDIAC) 20 MG/ML IV SOLN
INTRAVENOUS | Status: DC | PRN
  Administered 2015-03-10: 50 mg via INTRAVENOUS

## 2015-03-10 MED ORDER — HYDROMORPHONE HCL 1 MG/ML IJ SOLN: 0.2500 mg | INTRAMUSCULAR | Status: DC | PRN

## 2015-03-10 MED ORDER — GLYCOPYRROLATE 0.2 MG/ML IJ SOLN: 0.2000 mg | Freq: Once | INTRAMUSCULAR | Status: DC | PRN

## 2015-03-10 MED ORDER — LIDOCAINE HCL (CARDIAC) 20 MG/ML IV SOLN
INTRAVENOUS | Status: AC
  Filled 2015-03-10: qty 5

## 2015-03-10 MED ORDER — BUPIVACAINE HCL (PF) 0.25 % IJ SOLN
INTRAMUSCULAR | Status: DC | PRN
  Administered 2015-03-10: 5 mL

## 2015-03-10 MED ORDER — SCOPOLAMINE 1 MG/3DAYS TD PT72: 1.0000 | MEDICATED_PATCH | Freq: Once | TRANSDERMAL | Status: DC | PRN

## 2015-03-10 MED ORDER — PROPOFOL 500 MG/50ML IV EMUL
INTRAVENOUS | Status: DC | PRN
  Administered 2015-03-10: 100 ug/kg/min via INTRAVENOUS

## 2015-03-10 MED ORDER — PROMETHAZINE HCL 25 MG/ML IJ SOLN: 6.2500 mg | INTRAMUSCULAR | Status: DC | PRN

## 2015-03-10 MED ORDER — CEFAZOLIN SODIUM-DEXTROSE 2-3 GM-% IV SOLR
2.0000 g | INTRAVENOUS | Status: AC
  Administered 2015-03-10: 2 g via INTRAVENOUS

## 2015-03-10 MED ORDER — CEFAZOLIN SODIUM-DEXTROSE 2-3 GM-% IV SOLR
INTRAVENOUS | Status: AC
  Filled 2015-03-10: qty 50

## 2015-03-10 MED ORDER — ONDANSETRON HCL 4 MG/2ML IJ SOLN
INTRAMUSCULAR | Status: DC | PRN
  Administered 2015-03-10: 4 mg via INTRAVENOUS

## 2015-03-10 SURGICAL SUPPLY — 33 items
BANDAGE COBAN STERILE 2 (GAUZE/BANDAGES/DRESSINGS) ×3 IMPLANT
BLADE SURG 15 STRL LF DISP TIS (BLADE) ×1 IMPLANT
BLADE SURG 15 STRL SS (BLADE) ×3
BNDG CMPR 9X4 STRL LF SNTH (GAUZE/BANDAGES/DRESSINGS) ×1
BNDG ESMARK 4X9 LF (GAUZE/BANDAGES/DRESSINGS) ×2 IMPLANT
CHLORAPREP W/TINT 26ML (MISCELLANEOUS) ×3 IMPLANT
CORDS BIPOLAR (ELECTRODE) IMPLANT
COVER BACK TABLE 60X90IN (DRAPES) ×3 IMPLANT
COVER MAYO STAND STRL (DRAPES) ×3 IMPLANT
CUFF TOURNIQUET SINGLE 18IN (TOURNIQUET CUFF) ×2 IMPLANT
DECANTER SPIKE VIAL GLASS SM (MISCELLANEOUS) IMPLANT
DRAPE EXTREMITY T 121X128X90 (DRAPE) ×3 IMPLANT
DRAPE SURG 17X23 STRL (DRAPES) ×3 IMPLANT
GAUZE SPONGE 4X4 12PLY STRL (GAUZE/BANDAGES/DRESSINGS) ×3 IMPLANT
GAUZE XEROFORM 1X8 LF (GAUZE/BANDAGES/DRESSINGS) ×3 IMPLANT
GLOVE BIOGEL PI IND STRL 8.5 (GLOVE) ×1 IMPLANT
GLOVE BIOGEL PI INDICATOR 8.5 (GLOVE) ×2
GLOVE EXAM NITRILE EXT CUFF MD (GLOVE) ×2 IMPLANT
GLOVE SURG ORTHO 8.0 STRL STRW (GLOVE) ×3 IMPLANT
GLOVE SURG SS PI 7.0 STRL IVOR (GLOVE) ×2 IMPLANT
GOWN STRL REUS W/ TWL LRG LVL3 (GOWN DISPOSABLE) ×1 IMPLANT
GOWN STRL REUS W/TWL LRG LVL3 (GOWN DISPOSABLE) ×3
GOWN STRL REUS W/TWL XL LVL3 (GOWN DISPOSABLE) ×3 IMPLANT
NDL PRECISIONGLIDE 27X1.5 (NEEDLE) ×1 IMPLANT
NEEDLE PRECISIONGLIDE 27X1.5 (NEEDLE) ×3 IMPLANT
NS IRRIG 1000ML POUR BTL (IV SOLUTION) ×3 IMPLANT
PACK BASIN DAY SURGERY FS (CUSTOM PROCEDURE TRAY) ×3 IMPLANT
STOCKINETTE 4X48 STRL (DRAPES) ×3 IMPLANT
SUT ETHILON 4 0 PS 2 18 (SUTURE) ×4 IMPLANT
SYR BULB 3OZ (MISCELLANEOUS) ×3 IMPLANT
SYR CONTROL 10ML LL (SYRINGE) ×3 IMPLANT
TOWEL OR 17X24 6PK STRL BLUE (TOWEL DISPOSABLE) ×6 IMPLANT
UNDERPAD 30X30 (UNDERPADS AND DIAPERS) ×3 IMPLANT

## 2015-03-10 NOTE — Discharge Instructions (Addendum)
[facility] Instructions °Hand Surgery ° °Wound Care: °Keep your hand elevated above the level of your heart.  Do not allow it to dangle by your side.  Keep the dressing dry and do not remove it unless your doctor advises you to do so.  He will usually change it at the time of your post-op visit.  Moving your fingers is advised to stimulate circulation but will depend on the site of your surgery.  If you have a splint applied, your doctor will advise you regarding movement. ° °Activity: °Do not drive or operate machinery today.  Rest today and then you may return to your normal activity and work as indicated by your physician. ° °Diet:  °Drink liquids today or eat a light diet.  You may resume a regular diet tomorrow.   ° °General expectations: °Pain for two to three days. °Fingers may become slightly swollen. ° °Call your doctor if any of the following occur: °Severe pain not relieved by pain medication. °Elevated temperature. °Dressing soaked with blood. °Inability to move fingers. °White or bluish color to fingers. ° °Postoperative Anesthesia Instructions-Pediatric ° °Activity: °Your child should rest for the remainder of the day. A responsible adult should stay with your child for 24 hours. ° °Meals: °Your child should start with liquids and light foods such as gelatin or soup unless otherwise instructed by the physician. Progress to regular foods as tolerated. Avoid spicy, greasy, and heavy foods. If nausea and/or vomiting occur, drink only clear liquids such as apple juice or Pedialyte until the nausea and/or vomiting subsides. Call your physician if vomiting continues. ° °Special Instructions/Symptoms: °Your child may be drowsy for the rest of the day, although some children experience some hyperactivity a few hours after the surgery. Your child may also experience some irritability or crying episodes due to the operative procedure and/or anesthesia. Your child's throat may feel dry or sore from the  anesthesia or the breathing tube placed in the throat during surgery. Use throat lozenges, sprays, or ice chips if needed.  °

## 2015-03-10 NOTE — Transfer of Care (Signed)
Immediate Anesthesia Transfer of Care Note  Patient: Teresa Christian  Procedure(s) Performed: Procedure(s) with comments: RELEASE TRIGGER FINGER/A-1 PULLEY RIGHT THUMB (Right) - MAC with bier block  Patient Location: PACU  Anesthesia Type:Bier block  Level of Consciousness: awake and patient cooperative  Airway & Oxygen Therapy: Patient Spontanous Breathing and Patient connected to face mask oxygen  Post-op Assessment: Report given to RN and Post -op Vital signs reviewed and stable  Post vital signs: Reviewed and stable  Last Vitals:  Filed Vitals:   03/10/15 1015  BP: 137/88  Pulse: 62  Temp: 36.4 C  Resp: 20    Complications: No apparent anesthesia complications

## 2015-03-10 NOTE — Op Note (Signed)
Dictation Number [id number]

## 2015-03-10 NOTE — Anesthesia Procedure Notes (Signed)
Anesthesia Regional Block:  Bier block (IV Regional)  Pre-Anesthetic Checklist: ,, timeout performed, Correct Patient, Correct Site, Correct Laterality, Correct Procedure, Correct Position, site marked, Risks and benefits discussed,  Surgical consent,  Pre-op evaluation,  At surgeon's request and post-op pain management   Prep: chloraprep       Needles:       Needle Gauge: 21 and 21 G    Additional Needles: Bier block (IV Regional) Narrative:  Start time: 03/10/2015 11:45 AM End time: 03/10/2015 11:46 AM Injection made incrementally with aspirations every 30 mL.  Performed by: Personally   Additional Notes: ESMARK WRAP, TORQ INFLATED 290 , NEG PULSE, INJECTED SLOWLY 30CC 0.5% PRES FREE LIDO. TOL WELL. VSS

## 2015-03-10 NOTE — Op Note (Signed)
NAMEJohnston Ebbs.:  [id number]  MEDICAL RECORD NO.:  [id number]  LOCATION:                                 FACILITY:  PHYSICIAN:  Daryll Brod, M.D.            DATE OF BIRTH:  DATE OF PROCEDURE:  03/10/2015 DATE OF DISCHARGE:                              OPERATIVE REPORT   PREOPERATIVE DIAGNOSE:  Stenosing tenosynovitis, right thumb.  POSTOPERATIVE DIAGNOSIS:  Stenosing tenosynovitis, right thumb.  OPERATION:  Release of A1 pulley, right thumb.  SURGEON:  Daryll Brod, MD.  ANESTHESIA:  Forearm-based IV regional with local infiltration.  HISTORY:  The patient is a 60 year old female with a history of triggering of her right thumb.  This has not responded to conservative treatment including multiple injections.  She has elected to undergo surgical release of the A1 pulley.  Pre, peri, postoperative courses have been discussed along with risks and complications.  She is aware that there is no guarantee with the surgery; possibility of infection; recurrence of injury to arteries, nerves, tendons; incomplete relief of symptoms; and dystrophy.  In the preoperative area, the patient is seen, the extremity is marked by both patient and surgeon.  Antibiotic given.  PROCEDURE IN DETAIL:  The patient was brought to the operating room, where forearm-based IV regional anesthetic was carried out without difficulty.  She was prepped using ChloraPrep, supine position with the right arm free.  A 3-minute dry time was allowed.  Time-out taken, confirming the patient and procedure.  A transverse incision was made over the A1 pulley of the right thumb, carried down through subcutaneous tissue.  Neurovascular bundles radially and ulnarly were identified. Retractors placed.  The A1 pulley was found to be markedly thickened with the cyst present, this was incised on its radial aspect taking care to protect the oblique pulley.  The cyst was decompressed.  The tenosynovial  tissue proximally was separated.  The thumb placed through a full range motion, no further triggering was noted.  The wound was copiously irrigated with saline.  The skin closed with interrupted 4-0 nylon sutures.  A local infiltration with 0.25% bupivacaine without epinephrine was given, 5 mL was used.  Sterile compressive dressing with the fingers free was applied.  On deflation of the tourniquet, all fingers immediately pinked.  She was taken to the recovery room for observation in satisfactory condition.  She will be discharged home to return to the [place] in 1 week, on [place].         ______________________________ Daryll Brod, M.D.    GK/MEDQ  D:  03/10/2015  T:  03/10/2015  Job:  [id number]

## 2015-03-10 NOTE — Anesthesia Postprocedure Evaluation (Signed)
Anesthesia Post Note  Patient: Teresa Christian  Procedure(s) Performed: Procedure(s) (LRB): RELEASE TRIGGER FINGER/A-1 PULLEY RIGHT THUMB (Right)  Patient location during evaluation: PACU Anesthesia Type: MAC Level of consciousness: awake and alert Pain management: pain level controlled Vital Signs Assessment: post-procedure vital signs reviewed and stable Respiratory status: spontaneous breathing, nonlabored ventilation, respiratory function stable and patient connected to nasal cannula oxygen Cardiovascular status: stable and blood pressure returned to baseline Anesthetic complications: no    Last Vitals:  Filed Vitals:   03/10/15 1215 03/10/15 1238  BP: 122/71 125/71  Pulse: 71 62  Temp:  36.5 C  Resp: 18 18    Last Pain: There were no vitals filed for this visit.               [name] DANIEL

## 2015-03-10 NOTE — Brief Op Note (Signed)
03/10/2015  12:03 PM  PATIENT:  Teresa Christian  60 y.o. female  PRE-OPERATIVE DIAGNOSIS:  STENOSING TENOSYNOVITIS RIGHT THUMB  POST-OPERATIVE DIAGNOSIS:  STENOSING TENOSYNOVITIS RIGHT THUMB  PROCEDURE:  Procedure(s): RELEASE TRIGGER FINGER/A-1 PULLEY RIGHT THUMB (Right)  SURGEON:  Surgeon(s) and Role:    * Daryll Brod, MD - Primary  PHYSICIAN ASSISTANT:   ASSISTANTS: none   ANESTHESIA:   local and regional  EBL:  Total I/O In: 500 [I.V.:500] Out: -   BLOOD ADMINISTERED:none  DRAINS: none   LOCAL MEDICATIONS USED:  BUPIVICAINE   SPECIMEN:  No Specimen  DISPOSITION OF SPECIMEN:  N/A  COUNTS:  YES  TOURNIQUET:   Total Tourniquet Time Documented: Forearm (Right) - 16 minutes Total: Forearm (Right) - 16 minutes   DICTATION: .Other Dictation: Dictation Number [id number]  PLAN OF CARE: Discharge to home after PACU  PATIENT DISPOSITION:  PACU - hemodynamically stable.

## 2015-03-10 NOTE — Anesthesia Preprocedure Evaluation (Signed)
Anesthesia Evaluation  Patient identified by MRN, date of birth, ID band Patient awake    Reviewed: Allergy & Precautions, NPO status , Patient's Chart, lab work & pertinent test results  History of Anesthesia Complications Negative for: history of anesthetic complications  Airway Mallampati: II  TM Distance: >3 FB Neck ROM: Full    Dental  (+) Teeth Intact, Dental Advisory Given   Pulmonary neg pulmonary ROS,    Pulmonary exam normal        Cardiovascular negative cardio ROS Normal cardiovascular exam     Neuro/Psych PSYCHIATRIC DISORDERS Depression negative neurological ROS     GI/Hepatic Neg liver ROS, PUD, GERD  ,  Endo/Other  negative endocrine ROS  Renal/GU negative Renal ROS     Musculoskeletal negative musculoskeletal ROS (+)   Abdominal   Peds  Hematology   Anesthesia Other Findings   Reproductive/Obstetrics                             Anesthesia Physical Anesthesia Plan  ASA: II  Anesthesia Plan: MAC and Bier Block   Post-op Pain Management:    Induction:   Airway Management Planned: Simple Face Mask  Additional Equipment:   Intra-op Plan:   Post-operative Plan:   Informed Consent: I have reviewed the patients History and Physical, chart, labs and discussed the procedure including the risks, benefits and alternatives for the proposed anesthesia with the patient or authorized representative who has indicated his/her understanding and acceptance.   Dental advisory given  Plan Discussed with: CRNA, Anesthesiologist and Surgeon  Anesthesia Plan Comments:         Anesthesia Quick Evaluation

## 2015-03-10 NOTE — H&P (Signed)
Teresa Christian is an 60 y.o. female.   Chief Complaint: catching rt thumb HPI: Ms. Teresa Christian is a 60 year old right hand dominant female, Teresa Christian, M.D., wife, comes in complaining of recurrence of her triggering of her right thumb. She has had this bilaterally. Over the past several months, she has had left side injected which cured this. The right side has been injected on three occasions without relief. She continues to complain of pain with movement of her thumb. It is only mildly discomfort for her, but she is tired of it going on. She complains of a constant, moderate, throbbing pain with a feeling of weakness. She states it is getting worse. Activity makes it worse. She has been taking Mobic. She is allergic to the VALIUM. She has had a history of depression and bladder problems. She has had surgery including breast fibroadenoma. Her review of systems is positive for glasses, stomach ulcer and depression, otherwise.  Past Medical History  Diagnosis Date  . Depression  . Gastric ulcer due to Helicobacter pylori  . Overactive bladder  . Sjogren's disease ([place])  . Trigger thumb of both hands  right greater than left   Past Surgical History  Procedure Laterality Date  . Breast fibroadenoma surgery Left  . Other surgical history  bladder stent    Past Medical History  Diagnosis Date  . Herniated disc     C4, C5  . ANA positive     Sjogren's  . Osteopenia 07/2012    tscore -1.5. FRAX 4.1%/0.3%  . Sjoegren syndrome ([place])   . Vitamin D deficiency 05/2013    Value 20 05/2013  . Adenomatous colon polyp 07/2011    Dr Collene Mares  . Antral ulcer 07/2011    Dr Collene Mares  . Helicobacter pylori (H. pylori) 07/2011    Dr Collene Mares  . Hepatic cyst   . Depression   . GERD (gastroesophageal reflux disease)   . Renal lithiasis     Kidney stone with stent - 4 years ago    Past Surgical History  Procedure Laterality Date  . Dilation and curettage of uterus      mole  . Tubal ligation     . Vaginal hysterectomy      [facility]  . Umbilical hernia repair    . Kidney stint Right 2009  . Breast surgery Left     Fibroadenoma left breast    Family History  Problem Relation Age of Onset  . Hypertension Mother   . Diabetes Mother   . Breast cancer Maternal Aunt 81  . Heart disease Father   . Colon polyps Father   . Colon cancer Neg Hx   . Gallstones Father   . Hyperthyroidism Father    Social History:  reports that she has never smoked. She has never used smokeless tobacco. She reports that she drinks alcohol. She reports that she does not use illicit drugs.  Allergies:  Allergies  Allergen Reactions  . Valium [Diazepam] Anaphylaxis  . Sulfa Antibiotics Rash    Medications Prior to Admission  Medication Sig Dispense Refill  . escitalopram (LEXAPRO) 10 MG tablet Take 5 mg by mouth daily.     . mirabegron ER (MYRBETRIQ) 25 MG TB24 tablet Take 25 mg by mouth daily.    Marland Kitchen esomeprazole (NEXIUM) 20 MG capsule Take 20 mg by mouth daily at 12 noon.    . Vitamin D, Ergocalciferol, (DRISDOL) 50000 UNITS CAPS capsule Take 1 capsule (50,000 Units total) by mouth every 7 (seven)  days. 15 capsule 0    No results found for this or any previous visit (from the past 48 hour(s)).  No results found.   Pertinent items are noted in HPI.  Blood pressure 137/88, pulse 62, temperature 97.6 F (36.4 C), temperature source Oral, resp. rate 20, height 5\' 3"  (1.6 m), weight 68.04 kg (150 lb), last menstrual period 01/31/1994, SpO2 100 %.  General appearance: alert, cooperative and appears stated age Head: Normocephalic, without obvious abnormality Neck: no JVD Resp: clear to auscultation bilaterally Cardio: regular rate and rhythm, S1, S2 normal, no murmur, click, rub or gallop GI: soft, non-tender; bowel sounds normal; no masses,  no organomegaly Extremities: trigger right thumb Pulses: 2+ and symmetric Skin: Skin color, texture, turgor normal. No rashes or lesions Neurologic: Grossly  normal Incision/Wound: na  Assessment/Plan Diagnosis: trigger right thumb Plan: PLAN: She would like to have this surgically released. The pre and postoperative course are discussed along with risks and complications. She is aware there is no guarantee with the surgery, possibility of infection, recurrence, injury to arteries, nerves, tendons, complete release of symptoms dystrophy. She is scheduled for release A1 pulley, right thumb as an outpatient under regional anesthesia.   Caroleen Stoermer R 03/10/2015, 11:19 AM

## 2015-03-11 ENCOUNTER — Encounter (HOSPITAL_BASED_OUTPATIENT_CLINIC_OR_DEPARTMENT_OTHER): Payer: Self-pay | Admitting: Orthopedic Surgery

## 2015-06-18 ENCOUNTER — Other Ambulatory Visit: Payer: Self-pay

## 2015-06-18 ENCOUNTER — Encounter: Payer: Self-pay | Admitting: Gynecology

## 2015-06-18 ENCOUNTER — Ambulatory Visit (INDEPENDENT_AMBULATORY_CARE_PROVIDER_SITE_OTHER): Payer: BC Managed Care – PPO | Admitting: Gynecology

## 2015-06-18 VITALS — BP 120/76 | Ht 62.0 in | Wt 148.0 lb

## 2015-06-18 DIAGNOSIS — N951 Menopausal and female climacteric states: Secondary | ICD-10-CM

## 2015-06-18 DIAGNOSIS — Z01419 Encounter for gynecological examination (general) (routine) without abnormal findings: Secondary | ICD-10-CM | POA: Diagnosis not present

## 2015-06-18 DIAGNOSIS — Z1231 Encounter for screening mammogram for malignant neoplasm of breast: Secondary | ICD-10-CM

## 2015-06-18 LAB — THYROID PANEL
FREE T4: 0.9 ng/dL (ref 0.8–1.8)
T3 Uptake: 27 % (ref 22–35)
T4 TOTAL: 6.8 ug/dL (ref 4.5–12.0)
TSH: 1.97 m[IU]/L

## 2015-06-18 MED ORDER — ESTRADIOL 0.1 MG/24HR TD PTTW: 1.0000 | MEDICATED_PATCH | TRANSDERMAL | Status: DC

## 2015-06-18 NOTE — Patient Instructions (Signed)
Start on the estrogen patches twice weekly. Call me if you have any issues with this. Increase your Lexapro to 10 mg daily.

## 2015-06-18 NOTE — Addendum Note (Signed)
Addended by: Nelva Nay on: 06/18/2015 12:58 PM   Modules accepted: Orders, SmartSet

## 2015-06-18 NOTE — Progress Notes (Signed)
    Teresa Christian [birth year] [id number]        60 y.o.  [id number]  for annual exam. Last visit approximately 3 years ago. Has been actively followed by her primary physician. Had been on HRT in the past but discontinued and was doing well over the last several years until most recently with the onset of significant hot flushes, night sweats and mood swings. No change in weight, hair or skin.  History of vaginal hysterectomy in the past.  Past medical history,surgical history, problem list, medications, allergies, family history and social history were all reviewed and documented as reviewed in the EPIC chart.  ROS:  Performed with pertinent positives and negatives included in the history, assessment and plan.   Additional significant findings :  none   Exam: Teresa Christian assistant Filed Vitals:   06/18/15 1209  BP: 120/76  Height: 5\' 2"  (1.575 m)  Weight: 148 lb (67.132 kg)   General appearance:  Normal affect, orientation and appearance. Skin: Grossly normal HEENT: Without gross lesions.  No cervical or supraclavicular adenopathy. Thyroid normal.  Lungs:  Clear without wheezing, rales or rhonchi Cardiac: RR, without RMG Abdominal:  Soft, nontender, without masses, guarding, rebound, organomegaly or hernia Breasts:  Examined lying and sitting without masses, retractions, discharge or axillary adenopathy. Pelvic:  Ext/BUS/vagina normal. Pap smear of cuff done  Adnexa without masses or tenderness    Anus and perineum normal   Rectovaginal normal sphincter tone without palpated masses or tenderness.    Assessment/Plan:  60 y.o. [id number] female for annual exam.   1. Menopausal symptoms. Had been doing well previously but now with unacceptable hot flushes, night sweats, mood swings. Is on low-dose Lexapro 5 mg daily. Recommended increased to 10 mg daily to help with the mood swings. Options for management as far as the other symptoms were reviewed and ultimately decided to reinitiate  ERT. Had been on Femring but would like to try something different due to expense. Risks to include increased risk of stroke heart attack DVT possible breast cancer reviewed. We'll initiate minivelle 0.1 mg patches twice weekly. Refill 1 year. Call me if inadequate response or any issues. Check baseline thyroid panel rule out other etiologies for her symptoms. 2. Mammography due now and patient has scheduled. SBE monthly reviewed. 3. DEXA through Dr. Silvestre Mesi office 2016 reportedly improved from her prior DEXA 2014 showing osteopenia T score -1.5 FRAX 4.1%/0.3% 4. Colonoscopy 2013. Repeat at their recommended interval. 5. Pap smear 2010.  Pap smear vaginal cuff done today. No history of abnormal Pap smears previously. Discussed current screening guidelines and options to stop screening altogether based on hysterectomy history versus less frequent screening intervals reviewed. Will readdress on an annual basis. 6. Health maintenance. No routine lab work done today as this is done through her primary physician's office. Follow up in one year, sooner as needed.   [name], 12:43 PM 06/18/2015

## 2015-06-19 LAB — PAP IG W/ RFLX HPV ASCU

## 2015-07-03 ENCOUNTER — Ambulatory Visit
Admission: RE | Admit: 2015-07-03 | Discharge: 2015-07-03 | Disposition: A | Discharge status: Home / Self Care | Payer: BC Managed Care – PPO | Source: Ambulatory Visit

## 2015-07-03 DIAGNOSIS — Z1231 Encounter for screening mammogram for malignant neoplasm of breast: Secondary | ICD-10-CM

## 2015-07-07 ENCOUNTER — Other Ambulatory Visit: Payer: Self-pay | Admitting: Gynecology

## 2015-07-07 ENCOUNTER — Ambulatory Visit
Admission: RE | Admit: 2015-07-07 | Discharge: 2015-07-07 | Disposition: A | Discharge status: Home / Self Care | Payer: BC Managed Care – PPO | Source: Ambulatory Visit | Attending: Gynecology | Admitting: Gynecology

## 2015-07-07 DIAGNOSIS — N6489 Other specified disorders of breast: Secondary | ICD-10-CM

## 2015-07-28 ENCOUNTER — Other Ambulatory Visit: Payer: Self-pay | Admitting: Women's Health

## 2015-07-28 MED ORDER — FLUCONAZOLE 150 MG PO TABS: ORAL_TABLET | ORAL | Status: DC

## 2015-07-28 NOTE — Progress Notes (Signed)
Telephone call from patient reports vaginal discharge with itching requesting Diflucan

## 2015-10-15 ENCOUNTER — Ambulatory Visit (INDEPENDENT_AMBULATORY_CARE_PROVIDER_SITE_OTHER): Payer: BC Managed Care – PPO | Admitting: Gynecology

## 2015-10-15 DIAGNOSIS — Z23 Encounter for immunization: Secondary | ICD-10-CM | POA: Diagnosis not present

## 2016-06-01 ENCOUNTER — Other Ambulatory Visit: Payer: Self-pay | Admitting: Gynecology

## 2016-06-01 DIAGNOSIS — Z1231 Encounter for screening mammogram for malignant neoplasm of breast: Secondary | ICD-10-CM

## 2016-07-04 ENCOUNTER — Ambulatory Visit
Admission: RE | Admit: 2016-07-04 | Discharge: 2016-07-04 | Disposition: A | Discharge status: Home / Self Care | Payer: BC Managed Care – PPO | Source: Ambulatory Visit | Attending: Gynecology | Admitting: Gynecology

## 2016-07-04 DIAGNOSIS — Z1231 Encounter for screening mammogram for malignant neoplasm of breast: Secondary | ICD-10-CM

## 2016-07-08 ENCOUNTER — Encounter: Payer: Self-pay | Admitting: Gynecology

## 2016-07-08 ENCOUNTER — Ambulatory Visit (INDEPENDENT_AMBULATORY_CARE_PROVIDER_SITE_OTHER): Payer: BC Managed Care – PPO | Admitting: Gynecology

## 2016-07-08 VITALS — BP 120/76 | Ht 61.5 in | Wt 151.0 lb

## 2016-07-08 DIAGNOSIS — Z01411 Encounter for gynecological examination (general) (routine) with abnormal findings: Secondary | ICD-10-CM | POA: Diagnosis not present

## 2016-07-08 DIAGNOSIS — R10814 Left lower quadrant abdominal tenderness: Secondary | ICD-10-CM

## 2016-07-08 DIAGNOSIS — N952 Postmenopausal atrophic vaginitis: Secondary | ICD-10-CM

## 2016-07-08 LAB — URINALYSIS W MICROSCOPIC + REFLEX CULTURE
Bilirubin Urine: NEGATIVE
CRYSTALS: NONE SEEN [HPF]
Casts: NONE SEEN [LPF]
Glucose, UA: NEGATIVE
Ketones, ur: NEGATIVE
Nitrite: NEGATIVE
PROTEIN: NEGATIVE
Specific Gravity, Urine: 1.025 (ref 1.001–1.035)
YEAST: NONE SEEN [HPF]
pH: 6.5 (ref 5.0–8.0)

## 2016-07-08 NOTE — Patient Instructions (Signed)
Follow-up for the ultrasound as scheduled. 

## 2016-07-08 NOTE — Progress Notes (Signed)
    Teresa Christian 02/23/1955 [id number]        61 y.o.  [id number] for annual exam.  Also notes some left lower quadrant ill-defined discomfort. Feels bloating to full feeling coming and going. Not consistent. No nausea vomiting diarrhea constipation. No urinary symptoms such as frequency, dysuria, urgency, low back pain, fever or chills.  Past medical history,surgical history, problem list, medications, allergies, family history and social history were all reviewed and documented as reviewed in the EPIC chart.  ROS:  Performed with pertinent positives and negatives included in the history, assessment and plan.   Additional significant findings :  None   Exam: [name] assistant Vitals:   07/08/16 1417  BP: 120/76  Weight: 151 lb (68.5 kg)  Height: 5' 1.5" (1.562 m)   Body mass index is 28.07 kg/m.  General appearance:  Normal affect, orientation and appearance. Skin: Grossly normal HEENT: Without gross lesions.  No cervical or supraclavicular adenopathy. Thyroid normal.  Lungs:  Clear without wheezing, rales or rhonchi Cardiac: RR, without RMG Abdominal:  Soft, nontender, without masses, guarding, rebound, organomegaly or hernia Breasts:  Examined lying and sitting without masses, retractions, discharge or axillary adenopathy. Pelvic:  Ext, BUS, Vagina: With atrophic changes  Adnexa: Without masses or tenderness    Anus and perineum: Normal   Rectovaginal: Normal sphincter tone without palpated masses or tenderness.    Assessment/Plan:  61 y.o. [id number] female for annual exam.   1. Left lower quadrant ill-defined discomfort/bloating. Comes and goes. Present for several months. No GI or GU symptomatology. Recommend baseline ultrasound to rule out ovarian pathology. Patient will schedule follow up for this.  2. Postmenopausal/atrophic genital changes.  Had been on ERT but discontinued this. Having some hot flushes but these are acceptable and she is not interested in  continuing any medication. Will monitor and as long as she does well then continue without medication. 3. Osteopenia.  Reports DEXA through Dr. Silvestre Mesi office 2016 with planned repeat this coming year. She'll follow up with them in reference to this. 4. Mammography 07/2016. Continue with annual mammography next year. SBE monthly reviewed. Breast exam normal today. 5. Colonoscopy 2013. Due to repeat now at 5 year interval noting she had a adenomatous polyp. She's going to arrange this with Dr. Collene Mares at her convenience. 6. Pap smear 2017. No Pap smear done today. Discussed current screening guidelines and options to stop screening altogether based on hysterectomy history and no history of abnormal Pap smears reviewed. Will readdress on annual basis. 7. Health maintenance. No routine lab work done as patient does this elsewhere. Follow up 1 year, sooner as needed.   [name], 2:45 PM 07/08/2016

## 2016-07-09 LAB — URINE CULTURE: ORGANISM ID, BACTERIA: NO GROWTH

## 2016-07-11 ENCOUNTER — Telehealth: Payer: Self-pay

## 2016-07-11 ENCOUNTER — Telehealth: Payer: Self-pay | Admitting: Gynecology

## 2016-07-11 DIAGNOSIS — R3129 Other microscopic hematuria: Secondary | ICD-10-CM

## 2016-07-11 NOTE — Telephone Encounter (Signed)
Tell patient her urinalysis looked contaminated with some red cells but also white and squamous cells. Recommend repeating a clean catch urinalysis when she comes in for her ultrasound.

## 2016-07-11 NOTE — Telephone Encounter (Signed)
Per Dr. Loetta Rough message "Tell patient her urinalysis looked contaminated with some red cells but also white and squamous cells. Recommend repeating a clean catch urinalysis when she comes in for her ultrasound.  I called patient and informed her. Order placed and note added to appt notes.

## 2016-07-11 NOTE — Telephone Encounter (Signed)
Patient was informed. Order placed.

## 2016-07-13 ENCOUNTER — Ambulatory Visit (INDEPENDENT_AMBULATORY_CARE_PROVIDER_SITE_OTHER): Payer: BC Managed Care – PPO | Admitting: Gynecology

## 2016-07-13 ENCOUNTER — Encounter: Payer: Self-pay | Admitting: Gynecology

## 2016-07-13 ENCOUNTER — Ambulatory Visit (INDEPENDENT_AMBULATORY_CARE_PROVIDER_SITE_OTHER): Payer: BC Managed Care – PPO

## 2016-07-13 VITALS — BP 120/74

## 2016-07-13 DIAGNOSIS — R140 Abdominal distension (gaseous): Secondary | ICD-10-CM

## 2016-07-13 DIAGNOSIS — R319 Hematuria, unspecified: Secondary | ICD-10-CM | POA: Diagnosis not present

## 2016-07-13 DIAGNOSIS — R10814 Left lower quadrant abdominal tenderness: Secondary | ICD-10-CM

## 2016-07-13 LAB — URINALYSIS W MICROSCOPIC + REFLEX CULTURE
Bacteria, UA: NONE SEEN [HPF]
Bilirubin Urine: NEGATIVE
Casts: NONE SEEN [LPF]
Crystals: NONE SEEN [HPF]
Glucose, UA: NEGATIVE
Ketones, ur: NEGATIVE
Leukocytes, UA: NEGATIVE
Nitrite: NEGATIVE
Protein, ur: NEGATIVE
Specific Gravity, Urine: 1.021 (ref 1.001–1.035)
Yeast: NONE SEEN [HPF]
pH: 6 (ref 5.0–8.0)

## 2016-07-13 NOTE — Progress Notes (Signed)
    Teresa Christian 12-14-1955 [id number]        61 y.o.  [name] presents for ultrasound. Patient is having some lower abdominal ill-defined bloating. Comes and goes. Does have an issue of some irritable bowel. Is due for colonoscopy now is in the process of ulna with her gastroenterologist. Ultrasound ordered to rule out ovarian process.  Past medical history,surgical history, problem list, medications, allergies, family history and social history were all reviewed and documented in the EPIC chart.  Directed ROS with pertinent positives and negatives documented in the history of present illness/assessment and plan.  Exam: Vitals:   07/13/16 0833  BP: 120/74   General appearance:  Normal  Ultrasound transvaginal status post hysterectomy shows vaginal cuff normal. Right and left ovaries visualized and normal. Microcalcifications noted in both ovaries. Cul-de-sac negative.   Assessment/Plan:  61 y.o. [id number] with history of abdominal bloating. Ultrasound shows normal-appearing ovaries status post hysterectomy. Reviewed that I feel her symptoms are secondary to GI she's going to make an appointment to see her gastroenterologist. I did ask her to repeat her urinalysis as the last urinalysis appeared contaminated. She did have microscopic hematuria but relates that she always has this and it is attributable to renal lithiasis that she has. She's been evaluated by her urologist for this. She is going to recheck a urinalysis today. Otherwise she will follow up in one year when due for annual exam.    [name], 9:03 AM 07/13/2016

## 2016-07-13 NOTE — Patient Instructions (Signed)
Follow-up with your gastroenterologist.  

## 2016-07-14 LAB — URINE CULTURE: Organism ID, Bacteria: NO GROWTH

## 2016-08-30 ENCOUNTER — Ambulatory Visit (INDEPENDENT_AMBULATORY_CARE_PROVIDER_SITE_OTHER): Payer: BC Managed Care – PPO | Admitting: Gynecology

## 2016-08-30 ENCOUNTER — Encounter: Payer: Self-pay | Admitting: Gynecology

## 2016-08-30 VITALS — BP 102/68 | Wt 152.4 lb

## 2016-08-30 DIAGNOSIS — N3000 Acute cystitis without hematuria: Secondary | ICD-10-CM

## 2016-08-30 DIAGNOSIS — R350 Frequency of micturition: Secondary | ICD-10-CM

## 2016-08-30 MED ORDER — FLUCONAZOLE 150 MG PO TABS: 150.0000 mg | ORAL_TABLET | Freq: Once | ORAL | 0 refills | Status: DC

## 2016-08-30 MED ORDER — PHENAZOPYRIDINE HCL 95 MG PO TABS: 95.0000 mg | ORAL_TABLET | Freq: Three times a day (TID) | ORAL | 0 refills | Status: DC | PRN

## 2016-08-30 MED ORDER — FLUCONAZOLE 150 MG PO TABS: 150.0000 mg | ORAL_TABLET | Freq: Once | ORAL | 0 refills | Status: AC

## 2016-08-30 MED ORDER — CIPROFLOXACIN HCL 250 MG PO TABS: 250.0000 mg | ORAL_TABLET | Freq: Two times a day (BID) | ORAL | 0 refills | Status: DC

## 2016-08-30 NOTE — Patient Instructions (Signed)
Take the ciprofloxacin twice daily for 7 days. Take the Pyridium 3 times daily as needed for bladder spasms. Use the Diflucan if you develop yeast symptoms.

## 2016-08-30 NOTE — Addendum Note (Signed)
Addended by: Anastasio Auerbach on: 08/30/2016 08:53 AM   Modules accepted: Orders

## 2016-08-30 NOTE — Progress Notes (Signed)
    Teresa Christian 10/06/1955 [id number]        61 y.o.  [id number] presents with several day history of frequency, urgency, suprapubic/lower abdominal discomfort and low back pain. No fever or chills. No real dysuria. No vaginal discharge or odor. No nausea vomiting diarrhea constipation.  Past medical history,surgical history, problem list, medications, allergies, family history and social history were all reviewed and documented in the EPIC chart.  Directed ROS with pertinent positives and negatives documented in the history of present illness/assessment and plan.  Exam: Vitals:   08/30/16 0819  BP: 102/68  Weight: 152 lb 6.4 oz (69.1 kg)   General appearance:  Normal Spine straight without CVA tenderness Abdomen soft with mild suprapubic tenderness. No rebound guarding or masses.  Assessment/Plan:  61 y.o. [id number] with history consistent with UTI. Unable to leave sufficient urine to run urine analysis. Will cover with ciprofloxacin 250 mg twice a day 7 days. Pyridium 200 mg 3 times a day times several days for discomfort. Diflucan 150 mg 1 as patient frequently gets yeast after antibiotics. Follow up if symptoms persist, worsen or recur.    [name], 8:36 AM 08/30/2016

## 2017-07-12 ENCOUNTER — Other Ambulatory Visit: Payer: Self-pay | Admitting: Internal Medicine

## 2017-07-12 DIAGNOSIS — Z1231 Encounter for screening mammogram for malignant neoplasm of breast: Secondary | ICD-10-CM

## 2017-07-28 ENCOUNTER — Ambulatory Visit
Admission: RE | Admit: 2017-07-28 | Discharge: 2017-07-28 | Disposition: A | Discharge status: Home / Self Care | Payer: BLUE CROSS/BLUE SHIELD | Source: Ambulatory Visit | Attending: Internal Medicine | Admitting: Internal Medicine

## 2017-07-28 DIAGNOSIS — Z1231 Encounter for screening mammogram for malignant neoplasm of breast: Secondary | ICD-10-CM

## 2018-07-16 ENCOUNTER — Other Ambulatory Visit: Payer: Self-pay | Admitting: Internal Medicine

## 2018-07-16 DIAGNOSIS — Z1231 Encounter for screening mammogram for malignant neoplasm of breast: Secondary | ICD-10-CM

## 2018-08-29 ENCOUNTER — Other Ambulatory Visit: Payer: Self-pay

## 2018-08-29 ENCOUNTER — Ambulatory Visit
Admission: RE | Admit: 2018-08-29 | Discharge: 2018-08-29 | Disposition: A | Discharge status: Home / Self Care | Payer: BC Managed Care – PPO | Source: Ambulatory Visit | Attending: Internal Medicine | Admitting: Internal Medicine

## 2018-08-29 DIAGNOSIS — Z1231 Encounter for screening mammogram for malignant neoplasm of breast: Secondary | ICD-10-CM

## 2018-10-30 ENCOUNTER — Encounter: Payer: Self-pay | Admitting: Gynecology

## 2019-02-20 ENCOUNTER — Ambulatory Visit: Payer: BC Managed Care – PPO

## 2019-02-28 ENCOUNTER — Ambulatory Visit: Payer: Self-pay

## 2019-03-08 ENCOUNTER — Ambulatory Visit: Payer: Self-pay

## 2019-03-11 ENCOUNTER — Ambulatory Visit: Payer: Self-pay

## 2019-09-16 ENCOUNTER — Other Ambulatory Visit: Payer: Self-pay | Admitting: Internal Medicine

## 2019-09-16 DIAGNOSIS — Z1231 Encounter for screening mammogram for malignant neoplasm of breast: Secondary | ICD-10-CM

## 2019-10-24 ENCOUNTER — Ambulatory Visit: Payer: Self-pay

## 2019-10-24 ENCOUNTER — Other Ambulatory Visit: Payer: Self-pay

## 2019-10-24 ENCOUNTER — Ambulatory Visit
Admission: RE | Admit: 2019-10-24 | Discharge: 2019-10-24 | Disposition: A | Discharge status: Home / Self Care | Payer: Medicare PPO | Source: Ambulatory Visit | Attending: Internal Medicine | Admitting: Internal Medicine

## 2019-10-24 DIAGNOSIS — Z1231 Encounter for screening mammogram for malignant neoplasm of breast: Secondary | ICD-10-CM

## 2020-11-02 ENCOUNTER — Other Ambulatory Visit: Payer: Self-pay | Admitting: Internal Medicine

## 2020-11-02 DIAGNOSIS — Z1231 Encounter for screening mammogram for malignant neoplasm of breast: Secondary | ICD-10-CM

## 2020-11-03 ENCOUNTER — Other Ambulatory Visit: Payer: Self-pay

## 2020-11-03 ENCOUNTER — Ambulatory Visit
Admission: RE | Admit: 2020-11-03 | Discharge: 2020-11-03 | Disposition: A | Discharge status: Home / Self Care | Payer: Medicare PPO | Source: Ambulatory Visit | Attending: Internal Medicine | Admitting: Internal Medicine

## 2020-11-03 DIAGNOSIS — Z1231 Encounter for screening mammogram for malignant neoplasm of breast: Secondary | ICD-10-CM

## 2021-03-25 ENCOUNTER — Other Ambulatory Visit: Payer: Self-pay | Admitting: Internal Medicine

## 2021-03-25 DIAGNOSIS — E785 Hyperlipidemia, unspecified: Secondary | ICD-10-CM

## 2021-10-01 IMAGING — MG DIGITAL SCREENING BILAT W/ TOMO W/ CAD
8 series · 8 of 24 positions shown · non-contrast
Comparison: Previous exam(s).

CLINICAL DATA: Screening.

EXAM:
DIGITAL SCREENING BILATERAL MAMMOGRAM WITH TOMO AND CAD

[R MLO synth-2D]
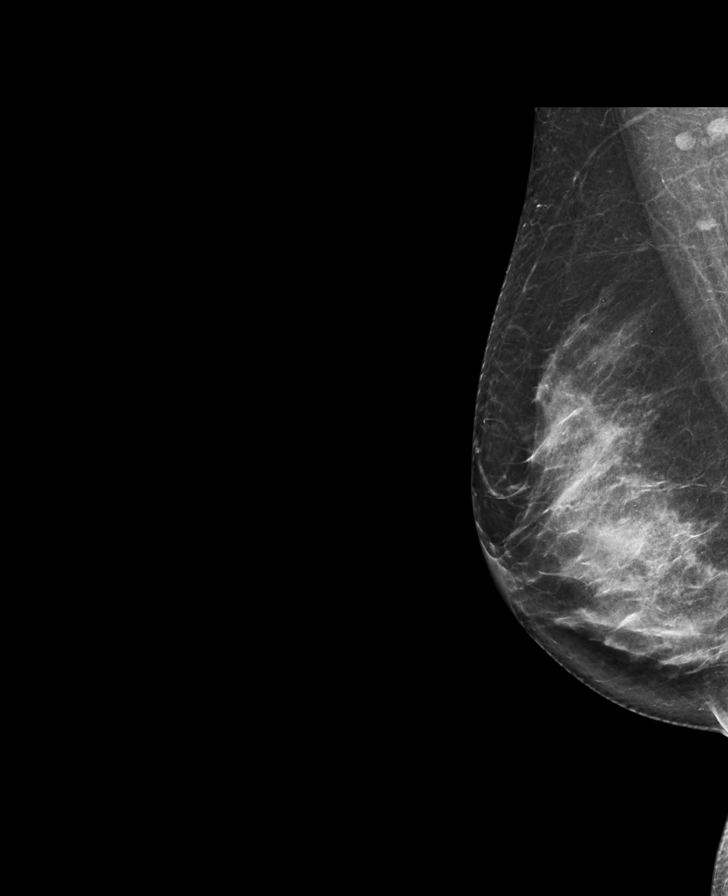

[L CC synth-2D]
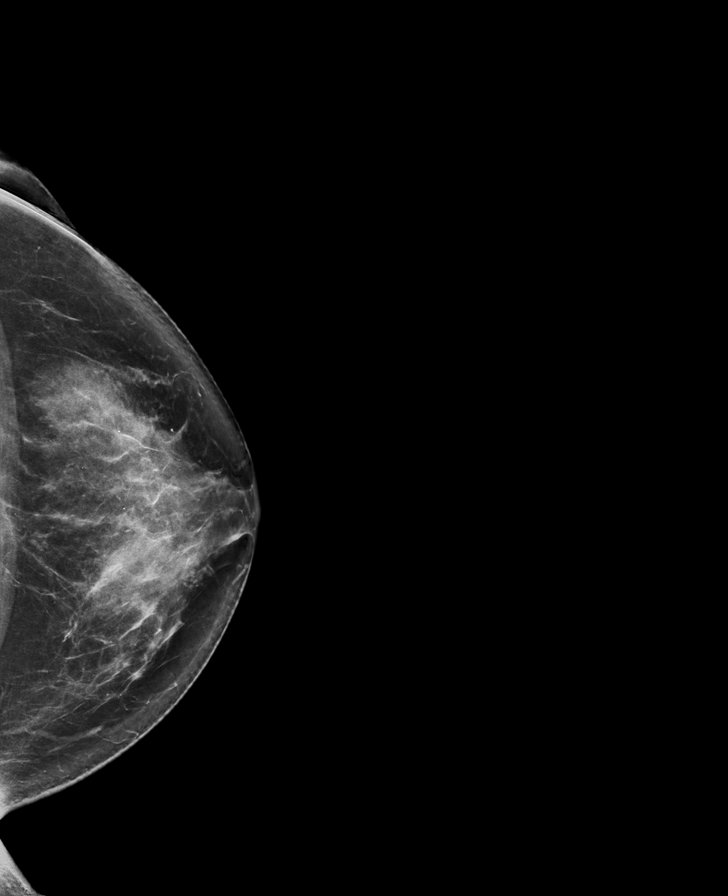

[L MLO synth-2D]
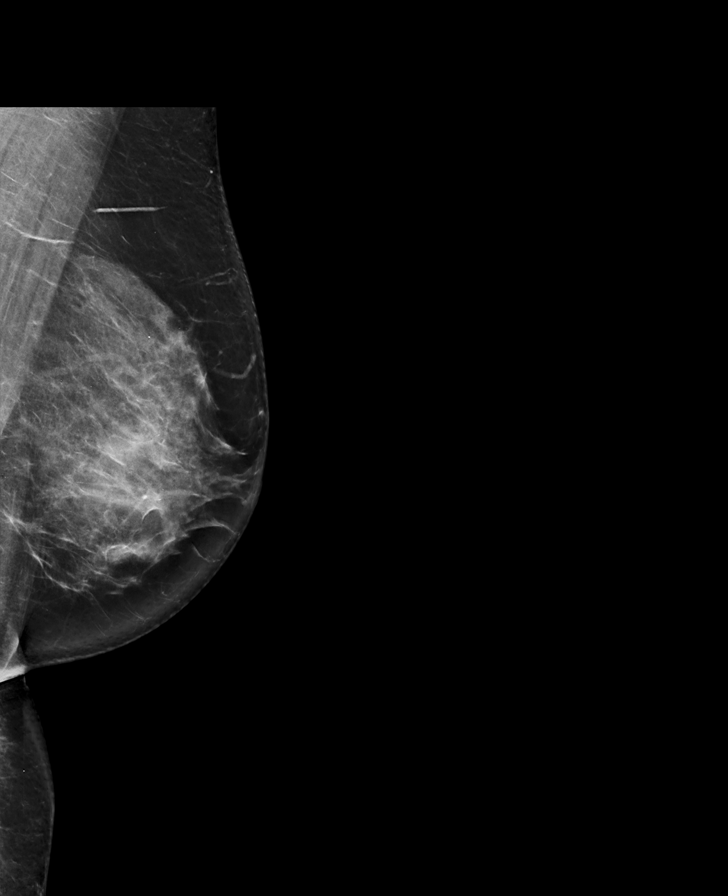

[R CC synth-2D]
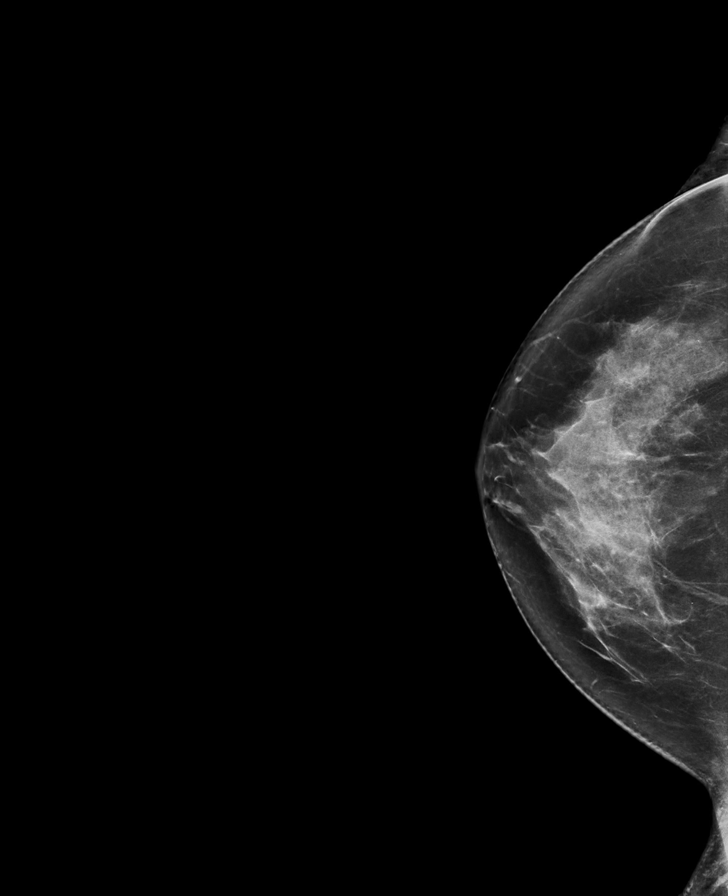

[R MLO tomo · tomo slice 39/77.0]
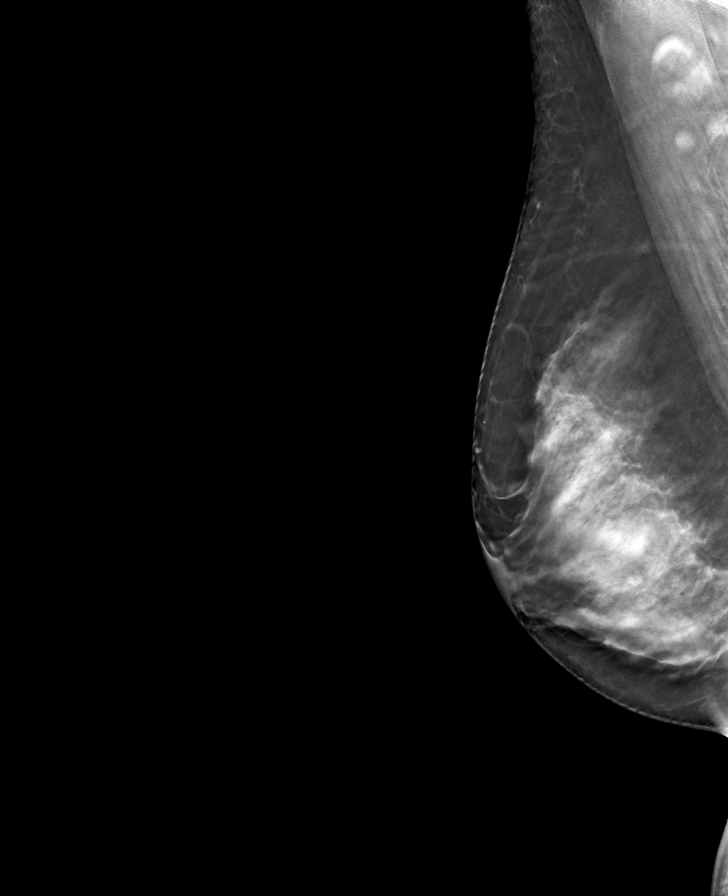

[R CC tomo · tomo slice 41/81.0]
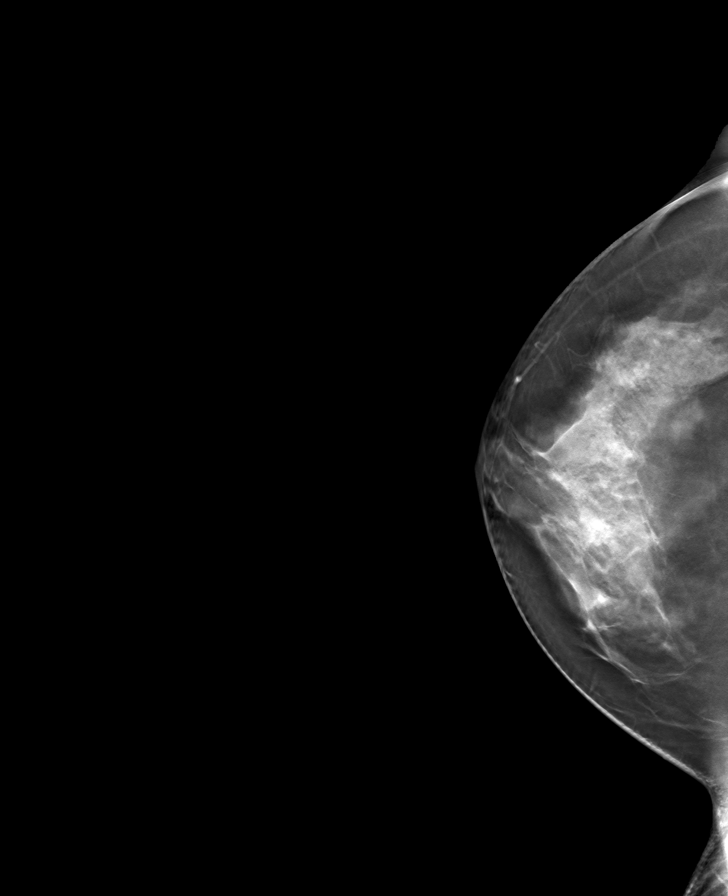

[L MLO tomo · tomo slice 43/84.0]
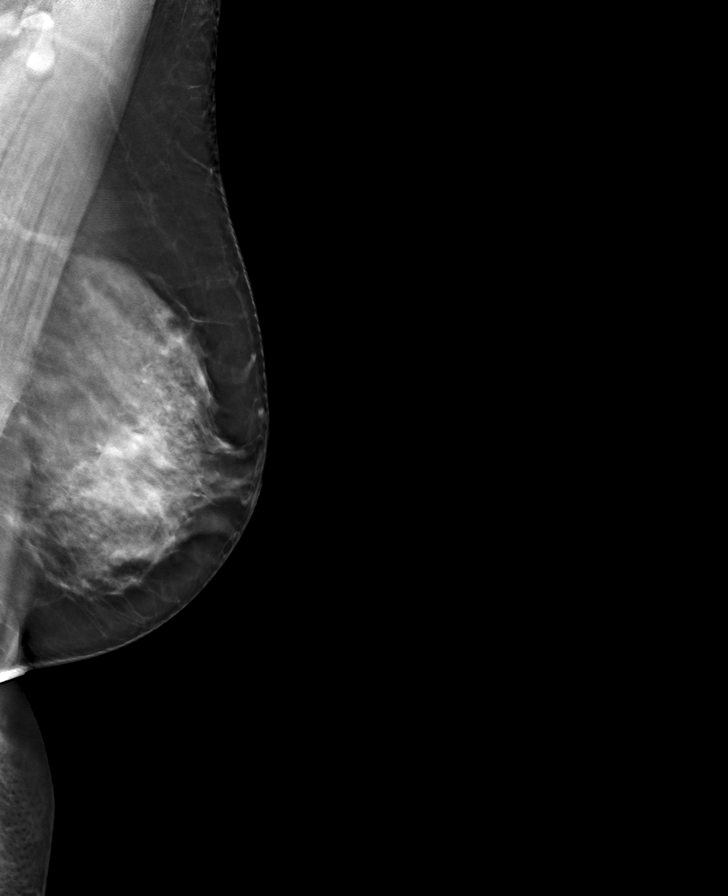

[L CC tomo · tomo slice 40/79.0]
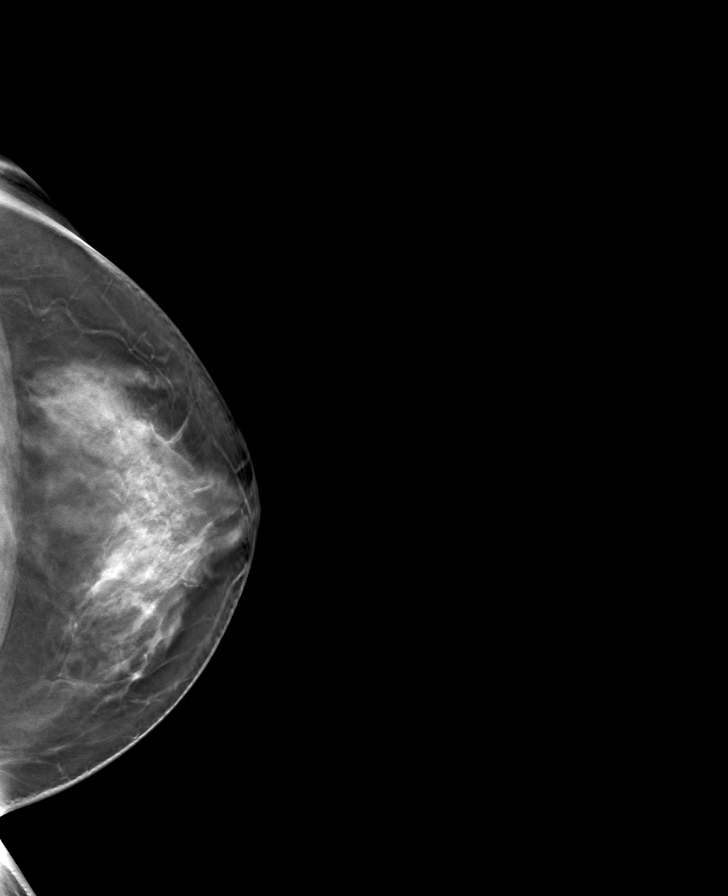

[8 of 24 positions shown; findings below may reference images not displayed]

ACR Breast Density Category c: The breast tissue is heterogeneously
dense, which may obscure small masses.
FINDINGS: There are no findings suspicious for malignancy. Images were
processed with CAD.
IMPRESSION: No mammographic evidence of malignancy. A result letter of this
screening mammogram will be mailed directly to the patient.

RECOMMENDATION:
Screening mammogram in one year. (Code:FT-U-LHB)

BI-RADS CATEGORY  1: Negative.

## 2022-04-20 ENCOUNTER — Other Ambulatory Visit: Payer: Self-pay | Admitting: Internal Medicine

## 2022-04-20 DIAGNOSIS — Z0000 Encounter for general adult medical examination without abnormal findings: Secondary | ICD-10-CM

## 2022-07-18 ENCOUNTER — Encounter (HOSPITAL_COMMUNITY): Payer: Self-pay

## 2022-07-18 ENCOUNTER — Other Ambulatory Visit (HOSPITAL_COMMUNITY): Payer: Self-pay

## 2022-07-18 ENCOUNTER — Emergency Department (HOSPITAL_COMMUNITY)
Admission: EM | Admit: 2022-07-18 | Discharge: 2022-07-18 | Disposition: A | Discharge status: Home / Self Care | Payer: Medicare PPO | Attending: Emergency Medicine | Admitting: Emergency Medicine

## 2022-07-18 ENCOUNTER — Emergency Department (HOSPITAL_COMMUNITY): Payer: Medicare PPO

## 2022-07-18 ENCOUNTER — Other Ambulatory Visit: Payer: Self-pay

## 2022-07-18 DIAGNOSIS — K5732 Diverticulitis of large intestine without perforation or abscess without bleeding: Secondary | ICD-10-CM | POA: Insufficient documentation

## 2022-07-18 DIAGNOSIS — K5792 Diverticulitis of intestine, part unspecified, without perforation or abscess without bleeding: Secondary | ICD-10-CM

## 2022-07-18 DIAGNOSIS — R1031 Right lower quadrant pain: Secondary | ICD-10-CM | POA: Diagnosis present

## 2022-07-18 LAB — COMPREHENSIVE METABOLIC PANEL
ALT: 27 U/L (ref 0–44)
AST: 20 U/L (ref 15–41)
Albumin: 4.1 g/dL (ref 3.5–5.0)
Alkaline Phosphatase: 67 U/L (ref 38–126)
Anion gap: 9 (ref 5–15)
BUN: 14 mg/dL (ref 8–23)
CO2: 24 mmol/L (ref 22–32)
Calcium: 9.1 mg/dL (ref 8.9–10.3)
Chloride: 104 mmol/L (ref 98–111)
Creatinine, Ser: 0.83 mg/dL (ref 0.44–1.00)
GFR, Estimated: >60 mL/min (ref >60)
Glucose, Bld: 126 mg/dL — ABNORMAL HIGH (ref 70–99)
Potassium: 3.6 mmol/L (ref 3.5–5.1)
Sodium: 137 mmol/L (ref 135–145)
Total Bilirubin: 0.8 mg/dL (ref 0.3–1.2)
Total Protein: 7.7 g/dL (ref 6.5–8.1)

## 2022-07-18 LAB — CBC
HCT: 40 % (ref 36.0–46.0)
Hemoglobin: 14.2 g/dL (ref 12.0–15.0)
MCH: 32.6 pg (ref 26.0–34.0)
MCHC: 35.5 g/dL (ref 30.0–36.0)
MCV: 91.7 fL (ref 80.0–100.0)
Platelets: 165 10*3/uL (ref 150–400)
RBC: 4.36 MIL/uL (ref 3.87–5.11)
RDW: 11.6 % (ref 11.5–15.5)
WBC: 7.8 10*3/uL (ref 4.0–10.5)
nRBC: 0 % (ref 0.0–0.2)

## 2022-07-18 LAB — URINALYSIS, ROUTINE W REFLEX MICROSCOPIC
Bacteria, UA: NONE SEEN
Bilirubin Urine: NEGATIVE
Glucose, UA: NEGATIVE mg/dL
Ketones, ur: NEGATIVE mg/dL
Leukocytes,Ua: NEGATIVE
Nitrite: NEGATIVE
Protein, ur: NEGATIVE mg/dL
Specific Gravity, Urine: >1.046 — ABNORMAL HIGH (ref 1.005–1.030)
pH: 7 (ref 5.0–8.0)

## 2022-07-18 LAB — LACTIC ACID, PLASMA
Lactic Acid, Venous: 2.3 mmol/L (ref 0.5–1.9)
Lactic Acid, Venous: 1.9 mmol/L (ref 0.5–1.9)

## 2022-07-18 LAB — LIPASE, BLOOD: Lipase: 37 U/L (ref 11–51)

## 2022-07-18 MED ORDER — AMOXICILLIN-POT CLAVULANATE 875-125 MG PO TABS: 1.0000 | ORAL_TABLET | Freq: Two times a day (BID) | ORAL | 0 refills | Status: DC

## 2022-07-18 MED ORDER — IOHEXOL 300 MG/ML  SOLN
100.0000 mL | Freq: Once | INTRAMUSCULAR | Status: AC | PRN
  Administered 2022-07-18: 100 mL via INTRAVENOUS

## 2022-07-18 MED ORDER — ONDANSETRON 4 MG PO TBDP
4.0000 mg | ORAL_TABLET | Freq: Three times a day (TID) | ORAL | 0 refills | Status: DC | PRN
  Filled 2022-07-18 (×2): qty 20, 7d supply, fill #0

## 2022-07-18 MED ORDER — HYDROCODONE-ACETAMINOPHEN 5-325 MG PO TABS: 1.0000 | ORAL_TABLET | ORAL | 0 refills | Status: DC | PRN

## 2022-07-18 MED ORDER — LACTATED RINGERS IV BOLUS
1000.0000 mL | Freq: Once | INTRAVENOUS | Status: AC
  Administered 2022-07-18: 1000 mL via INTRAVENOUS

## 2022-07-18 MED ORDER — PIPERACILLIN-TAZOBACTAM 3.375 G IVPB 30 MIN
3.3750 g | Freq: Once | INTRAVENOUS | Status: AC
  Administered 2022-07-18: 3.375 g via INTRAVENOUS
  Filled 2022-07-18: qty 50

## 2022-07-18 MED ORDER — ONDANSETRON HCL 4 MG/2ML IJ SOLN
4.0000 mg | Freq: Once | INTRAMUSCULAR | Status: AC
  Administered 2022-07-18: 4 mg via INTRAVENOUS
  Filled 2022-07-18: qty 2

## 2022-07-18 MED ORDER — HYDROMORPHONE HCL 1 MG/ML IJ SOLN
0.5000 mg | Freq: Once | INTRAMUSCULAR | Status: AC
  Administered 2022-07-18: 0.5 mg via INTRAVENOUS
  Filled 2022-07-18: qty 1

## 2022-07-18 MED ORDER — HYDROCODONE-ACETAMINOPHEN 5-325 MG PO TABS
1.0000 | ORAL_TABLET | ORAL | 0 refills | Status: DC | PRN
  Filled 2022-07-18 (×2): qty 10, 2d supply, fill #0

## 2022-07-18 MED ORDER — AMOXICILLIN-POT CLAVULANATE 875-125 MG PO TABS
1.0000 | ORAL_TABLET | Freq: Two times a day (BID) | ORAL | 0 refills | Status: DC
  Filled 2022-07-18 (×2): qty 20, 10d supply, fill #0

## 2022-07-18 MED ORDER — ONDANSETRON 4 MG PO TBDP: 4.0000 mg | ORAL_TABLET | Freq: Three times a day (TID) | ORAL | 0 refills | Status: DC | PRN

## 2022-07-18 NOTE — ED Notes (Signed)
Patient given water for PO fluid challenge.

## 2022-07-18 NOTE — ED Triage Notes (Addendum)
Pt. Arrives POV c/o right lower quadrant abdominal pain and nausea. Pt. In tears when brought back to the room. Pt. States that her pain started Saturday along with having 2 episodes of diarrhea. Pt. Has hx of diverticulosis on the L side. Pt. States that her pain comes and goes, but when it comes it is a sharp pain.

## 2022-07-18 NOTE — ED Provider Notes (Signed)
8:51 AM Care of the patient assumed at signout.  Patient is awake, alert, smiling, pleasantly interactive.  Lactic acidosis has cleared with fluid resuscitation.  She and her daughter are aware of plan.  Patient has follow-up scheduled with her gastroenterologist in 3 days.   Gerhard Munch, MD 07/18/22 [phone]

## 2022-07-18 NOTE — Discharge Instructions (Signed)
As we discussed, your CT scan today shows evidence of diverticulitis without abscess or perforation.  Appendix appears normal.  Take the antibiotics and pain medication as prescribed.  Start with a clear liquid diet and advance slowly as tolerated.  Follow-up with your doctor for recheck in 2 days.  It is possible that early appendicitis may not show up on CT scan right away.  Therefore return to the ED sooner with worsening pain especially to the right lower abdomen, fever, vomiting or any other concerns.

## 2022-07-18 NOTE — ED Provider Notes (Signed)
[place] EMERGENCY DEPARTMENT AT [facility] Provider Note   CSN: [id number] Arrival date & time: 07/18/22  0539     History  Chief Complaint  Patient presents with   Abdominal Pain    Teresa Christian is a 67 y.o. female.  Patient with a history of diverticulosis, kidney stones, no chronic medical conditions and no prescription medications presenting with right-sided abdominal pain for the past 24 hours.  States pain comes and goes in waves but never goes away completely.  Pain is worse with palpation and movement.  Pain is to her right lower abdomen.  Associate with nausea but no vomiting.  Did have diarrhea the day before the pain started but none since.  No vomiting or fever.  No pain with urination or blood in the urine.  No chest pain or shortness of breath.  No abnormal vaginal bleeding or discharge.  No previous abdominal surgeries.  Does not feel like a kidney stone.  The history is provided by the patient and the spouse.  Abdominal Pain Associated symptoms: diarrhea and nausea   Associated symptoms: no cough, no dysuria, no fever, no hematuria, no shortness of breath and no vomiting        Home Medications Prior to Admission medications   Medication Sig Start Date End Date Taking? Authorizing Provider  Calcium Carb-Cholecalciferol (CALCIUM 1000 + D PO) Take by mouth.    [provider]  ciprofloxacin (CIPRO) 250 MG tablet Take 1 tablet (250 mg total) by mouth 2 (two) times daily. 08/30/16   Fontaine, Nadyne Coombes, MD  phenazopyridine (PYRIDIUM) 95 MG tablet Take 1 tablet (95 mg total) by mouth 3 (three) times daily as needed for pain. 08/30/16   Fontaine, Nadyne Coombes, MD      Allergies    Valium [diazepam] and Sulfa antibiotics    Review of Systems   Review of Systems  Constitutional:  Positive for activity change and appetite change. Negative for fever.  HENT:  Negative for congestion and rhinorrhea.   Respiratory:  Negative for cough, chest  tightness and shortness of breath.   Gastrointestinal:  Positive for abdominal pain, diarrhea and nausea. Negative for vomiting.  Genitourinary:  Negative for dysuria and hematuria.  Musculoskeletal:  Negative for arthralgias and myalgias.  Skin:  Negative for rash.  Neurological:  Negative for weakness, light-headedness and numbness.   all other systems are negative except as noted in the HPI and PMH.    Physical Exam Updated Vital Signs BP (!) 164/93 (BP Location: Right Arm)   Pulse (!) 107   Temp 97.9 F (36.6 C) (Oral)   Resp (!) 24   LMP 01/31/1994   SpO2 100%  Physical Exam Vitals and nursing note reviewed.  Constitutional:      General: She is not in acute distress.    Appearance: She is well-developed.  HENT:     Head: Normocephalic and atraumatic.     Mouth/Throat:     Pharynx: No oropharyngeal exudate.  Eyes:     Conjunctiva/sclera: Conjunctivae normal.     Pupils: Pupils are equal, round, and reactive to light.  Neck:     Comments: No meningismus. Cardiovascular:     Rate and Rhythm: Normal rate and regular rhythm.     Heart sounds: Normal heart sounds. No murmur heard. Pulmonary:     Effort: Pulmonary effort is normal. No respiratory distress.     Breath sounds: Normal breath sounds.  Abdominal:     Palpations: Abdomen is  soft.     Tenderness: There is abdominal tenderness. There is guarding. There is no rebound.     Comments: Right lower quadrant tenderness with guarding.  Musculoskeletal:        General: No tenderness. Normal range of motion.     Cervical back: Normal range of motion and neck supple.     Comments: No CVA tenderness  Skin:    General: Skin is warm.  Neurological:     Mental Status: She is alert and oriented to person, place, and time.     Cranial Nerves: No cranial nerve deficit.     Motor: No abnormal muscle tone.     Coordination: Coordination normal.     Comments:  5/5 strength throughout. CN 2-12 intact.Equal grip strength.    Psychiatric:        Behavior: Behavior normal.     ED Results / Procedures / Treatments   Labs (all labs ordered are listed, but only abnormal results are displayed) Labs Reviewed  COMPREHENSIVE METABOLIC PANEL - Abnormal; Notable for the following components:      Result Value   Glucose, Bld 126 (*)    All other components within normal limits  LACTIC ACID, PLASMA - Abnormal; Notable for the following components:   Lactic Acid, Venous 2.3 (*)    All other components within normal limits  LIPASE, BLOOD  CBC  URINALYSIS, ROUTINE W REFLEX MICROSCOPIC  LACTIC ACID, PLASMA    EKG None  Radiology CT ABDOMEN PELVIS W CONTRAST  Result Date: 07/18/2022 CLINICAL DATA:  67 year old female with right lower quadrant pain. EXAM: CT ABDOMEN AND PELVIS WITH CONTRAST TECHNIQUE: Multidetector CT imaging of the abdomen and pelvis was performed using the standard protocol following bolus administration of intravenous contrast. RADIATION DOSE REDUCTION: This exam was performed according to the departmental dose-optimization program which includes automated exposure control, adjustment of the mA and/or kV according to patient size and/or use of iterative reconstruction technique. CONTRAST:  OMNIPAQUE IOHEXOL 300 MG/ML  SOLN COMPARISON:  Noncontrast CT Abdomen and Pelvis 08/12/2008. FINDINGS: Lower chest: No cardiomegaly, pericardial effusion or pleural effusion. Symmetric dependent lung base opacity appears to be atelectasis superimposed on mild more generalized lung base subpleural scarring. Hepatobiliary: Possible Hepatic steatosis but otherwise negative liver and gallbladder. Pancreas: Negative. Spleen: Negative.  Incidental splenule (normal variant). Adrenals/Urinary Tract: Normal adrenal glands. Nonobstructed kidneys with symmetric renal enhancement and contrast excretion. Possible punctate nephrolithiasis left lower pole coronal image 75. And there was punctate nephrolithiasis on the prior  noncontrast exam. Tiny bilateral simple appearing renal cysts (no follow-up imaging recommended). Decompressed ureters and bladder. Occasional pelvic phleboliths. Stomach/Bowel: Decompressed rectum. Moderate to severe diverticulosis of the large bowel in the descending and sigmoid colon, but no significant inflammation in those segments. Mild retained stool in the transverse colon. Evidence of diverticulosis also in the right colon and there is confluent inflammation in the right gutter which appears associated with 1 or more diverticula at the junction of the cecum and ascending colon series 2, image 51 and coronal image 60. Underlying bowel wall thickening. No extraluminal gas or fluid. The appendix remains normal and is medial to the cecum on coronal image 57. Terminal ileum is decompressed. Regional small, reactive appearing lymph nodes. Other small bowel loops are decompressed throughout the abdomen and pelvis. Stomach and duodenum appear negative. No free air or free fluid identified. Vascular/Lymphatic: Normal caliber abdominal aorta, major arterial structures in the abdomen and pelvis appear patent and normal. Portal venous system is  patent. No lymphadenopathy. Reproductive: Absent uterus.  Ovaries are within normal limits. Other: No pelvis free fluid. Musculoskeletal: Grade 1 lumbar spondylolisthesis with facet arthropathy. No acute osseous abnormality identified. IMPRESSION: 1. Positive for Acute Diverticulitis of the right colon near the at the junction of the cecum and ascending segment. Confluent inflammation there but no evidence of abscess or other complicating features. Appendix remains normal. 2. Moderate to severe diverticulosis of the descending and sigmoid colon also. 3. Punctate nephrolithiasis. Electronically Signed   By: Odessa Fleming M.D.   On: 07/18/2022 06:53    Procedures Procedures    Medications Ordered in ED Medications  HYDROmorphone (DILAUDID) injection 0.5 mg (has no administration  in time range)  ondansetron (ZOFRAN) injection 4 mg (has no administration in time range)  lactated ringers bolus 1,000 mL (has no administration in time range)    ED Course/ Medical Decision Making/ A&P                             Medical Decision Making Amount and/or Complexity of Data Reviewed Labs: ordered. Decision-making details documented in ED Course. Radiology: ordered and independent interpretation performed. Decision-making details documented in ED Course. ECG/medicine tests: ordered and independent interpretation performed. Decision-making details documented in ED Course.  Risk Prescription drug management.   Right-sided abdominal tenderness with nausea and diarrhea.  Vital stable, no distress, abdomen soft but tender to the right lower quadrant with voluntary guarding.  Concern for appendicitis versus kidney stone versus ovarian pathology.  She reports normal pelvic ultrasound in March.  Will initiate fluids, pain and nausea control, check labs, CT scan  Patient feels improved on recheck.  CT scan as above shows normal appendix but does show diverticulitis involving the right colon.  No abscess or perforation.  Results reviewed interpreted by me.  Will attempt p.o. challenge and pain control.  Patient wishes to go home and does not want to be admitted for IV antibiotics.  This seems reasonable after pain and nausea are improved and controlled.  Advance diet slowly as tolerated and start with clear liquids today.  Follow-up with PCP for recheck in 2 days.  Discussed sometimes early appendicitis does not show up on imaging right away.  Advised to return to the ED with worsening pain especially her right lower abdomen, fever, vomiting, not able to eat or drink or any other concerns.        Final Clinical Impression(s) / ED Diagnoses Final diagnoses:  Diverticulitis    Rx / DC Orders ED Discharge Orders     None         Fernando Stoiber, Jeannett Senior, MD 07/18/22  [phone]

## 2022-07-18 NOTE — ED Notes (Signed)
Pt stated they need to use the restroom but was unable to produce any urine at the time.

## 2022-07-25 ENCOUNTER — Other Ambulatory Visit (HOSPITAL_COMMUNITY): Payer: Self-pay

## 2022-07-28 ENCOUNTER — Other Ambulatory Visit (HOSPITAL_COMMUNITY): Payer: Self-pay

## 2022-08-10 ENCOUNTER — Other Ambulatory Visit (HOSPITAL_COMMUNITY): Payer: Self-pay

## 2022-08-11 ENCOUNTER — Other Ambulatory Visit (HOSPITAL_COMMUNITY): Payer: Self-pay

## 2022-08-11 MED ORDER — AMOXICILLIN-POT CLAVULANATE 875-125 MG PO TABS
1.0000 | ORAL_TABLET | Freq: Two times a day (BID) | ORAL | 0 refills | Status: AC
  Filled 2022-08-11: qty 60, 30d supply, fill #0

## 2022-08-11 MED ORDER — AMOXICILLIN-POT CLAVULANATE 875-125 MG PO TABS
1.0000 | ORAL_TABLET | Freq: Two times a day (BID) | ORAL | 0 refills | Status: DC
  Filled 2022-08-11: qty 60, 30d supply, fill #0

## 2022-09-12 ENCOUNTER — Other Ambulatory Visit (HOSPITAL_COMMUNITY): Payer: Self-pay

## 2022-09-12 MED ORDER — GOLYTELY 236 G PO SOLR
ORAL | 0 refills | Status: DC
  Filled 2022-09-12: qty 4000, 1d supply, fill #0

## 2022-09-19 ENCOUNTER — Other Ambulatory Visit (HOSPITAL_COMMUNITY): Payer: Self-pay

## 2022-09-26 DIAGNOSIS — K5730 Diverticulosis of large intestine without perforation or abscess without bleeding: Secondary | ICD-10-CM | POA: Diagnosis not present

## 2022-09-26 DIAGNOSIS — Z09 Encounter for follow-up examination after completed treatment for conditions other than malignant neoplasm: Secondary | ICD-10-CM | POA: Diagnosis not present

## 2022-09-26 DIAGNOSIS — Z86010 Personal history of colonic polyps: Secondary | ICD-10-CM | POA: Diagnosis not present

## 2022-09-26 DIAGNOSIS — Z1211 Encounter for screening for malignant neoplasm of colon: Secondary | ICD-10-CM | POA: Diagnosis not present

## 2022-10-21 ENCOUNTER — Other Ambulatory Visit (HOSPITAL_COMMUNITY): Payer: Self-pay

## 2022-10-21 MED ORDER — FLUAD 0.5 ML IM SUSY
0.5000 mL | PREFILLED_SYRINGE | INTRAMUSCULAR | 0 refills | Status: AC
  Filled 2022-10-21: qty 0.5, 1d supply, fill #0

## 2022-11-24 ENCOUNTER — Other Ambulatory Visit (HOSPITAL_COMMUNITY): Payer: Self-pay

## 2022-11-24 MED ORDER — RSVPREF3 VAC RECOMB ADJUVANTED 120 MCG/0.5ML IM SUSR
0.5000 mL | INTRAMUSCULAR | 0 refills | Status: AC
  Filled 2022-11-24: qty 0.5, 1d supply, fill #0

## 2023-03-14 DIAGNOSIS — H524 Presbyopia: Secondary | ICD-10-CM | POA: Diagnosis not present

## 2023-03-14 DIAGNOSIS — H2513 Age-related nuclear cataract, bilateral: Secondary | ICD-10-CM | POA: Diagnosis not present

## 2023-03-14 DIAGNOSIS — Z79899 Other long term (current) drug therapy: Secondary | ICD-10-CM | POA: Diagnosis not present

## 2023-05-23 ENCOUNTER — Ambulatory Visit: Admitting: Dermatology

## 2023-05-23 ENCOUNTER — Encounter: Payer: Self-pay | Admitting: Dermatology

## 2023-05-23 VITALS — BP 137/85 | HR 91

## 2023-05-23 DIAGNOSIS — L608 Other nail disorders: Secondary | ICD-10-CM

## 2023-05-23 NOTE — Patient Instructions (Signed)

## 2023-05-23 NOTE — Progress Notes (Signed)
   New Patient Visit   Subjective  Teresa Christian is a 68 y.o. female who presents for the following: Concerned about spot on left 5th toenail. No personal hx of skin cancer. Son in law recently diagnosed with melanoma. 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   The following portions of the chart were reviewed this encounter and updated as appropriate: medications, allergies, medical history  Review of Systems:  No other skin or systemic complaints except as noted in HPI or Assessment and Plan.  Objective  Well appearing patient in no apparent distress; mood and affect are within normal limits.  A focused examination was performed of the following areas: Left foot  Relevant exam findings are noted in the Assessment and Plan.  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 Based on the clinical presentation, it is likely related to the patient's skin tone and chronic trauma or friction to the nail, which can cause pigment deposition in the nail matrix. This condition is generally benign, especially when there is no associated pain, tenderness, or significant change in the appearance of the nail. Given the absence of concerning features such as rapid changes in size, color, or the development of a nodule, the current approach involves watchful waiting. We will continue to monitor the condition and recommend a recheck in three months to ensure there are no changes or developments that would warrant further investigation. If there are any new symptoms or concerns before the follow-up, the patient is encouraged to reach out for earlier evaluation. 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  I, Haig Levan, Surg Tech III, am acting as scribe for Deneise Finlay, MD.   Documentation: I have reviewed the above documentation for accuracy and completeness, and I agree with the above.  Deneise Finlay, MD

## 2023-05-23 NOTE — Progress Notes (Signed)
 Teresa Christian

## 2023-07-10 ENCOUNTER — Other Ambulatory Visit (HOSPITAL_COMMUNITY): Payer: Self-pay

## 2023-07-10 DIAGNOSIS — K5790 Diverticulosis of intestine, part unspecified, without perforation or abscess without bleeding: Secondary | ICD-10-CM | POA: Diagnosis not present

## 2023-07-10 DIAGNOSIS — Z0189 Encounter for other specified special examinations: Secondary | ICD-10-CM | POA: Diagnosis not present

## 2023-07-10 DIAGNOSIS — Z79899 Other long term (current) drug therapy: Secondary | ICD-10-CM | POA: Diagnosis not present

## 2023-07-10 DIAGNOSIS — M3500 Sicca syndrome, unspecified: Secondary | ICD-10-CM | POA: Diagnosis not present

## 2023-07-10 DIAGNOSIS — R0600 Dyspnea, unspecified: Secondary | ICD-10-CM | POA: Diagnosis not present

## 2023-07-10 DIAGNOSIS — M8589 Other specified disorders of bone density and structure, multiple sites: Secondary | ICD-10-CM | POA: Diagnosis not present

## 2023-07-10 DIAGNOSIS — R7989 Other specified abnormal findings of blood chemistry: Secondary | ICD-10-CM | POA: Diagnosis not present

## 2023-07-10 DIAGNOSIS — E785 Hyperlipidemia, unspecified: Secondary | ICD-10-CM | POA: Diagnosis not present

## 2023-07-10 DIAGNOSIS — Z1212 Encounter for screening for malignant neoplasm of rectum: Secondary | ICD-10-CM | POA: Diagnosis not present

## 2023-07-10 DIAGNOSIS — Z1331 Encounter for screening for depression: Secondary | ICD-10-CM | POA: Diagnosis not present

## 2023-07-10 DIAGNOSIS — E7849 Other hyperlipidemia: Secondary | ICD-10-CM | POA: Diagnosis not present

## 2023-07-10 DIAGNOSIS — R82998 Other abnormal findings in urine: Secondary | ICD-10-CM | POA: Diagnosis not present

## 2023-07-10 DIAGNOSIS — F419 Anxiety disorder, unspecified: Secondary | ICD-10-CM | POA: Diagnosis not present

## 2023-07-10 DIAGNOSIS — J45998 Other asthma: Secondary | ICD-10-CM | POA: Diagnosis not present

## 2023-07-10 DIAGNOSIS — N6019 Diffuse cystic mastopathy of unspecified breast: Secondary | ICD-10-CM | POA: Diagnosis not present

## 2023-07-10 DIAGNOSIS — R7301 Impaired fasting glucose: Secondary | ICD-10-CM | POA: Diagnosis not present

## 2023-07-10 DIAGNOSIS — Z0000 Encounter for general adult medical examination without abnormal findings: Secondary | ICD-10-CM | POA: Diagnosis not present

## 2023-07-10 DIAGNOSIS — R945 Abnormal results of liver function studies: Secondary | ICD-10-CM | POA: Diagnosis not present

## 2023-07-10 MED ORDER — AMOXICILLIN-POT CLAVULANATE 875-125 MG PO TABS
1.0000 | ORAL_TABLET | Freq: Two times a day (BID) | ORAL | 0 refills | Status: AC
  Filled 2023-07-10: qty 20, 10d supply, fill #0

## 2023-07-10 MED ORDER — LAGEVRIO 200 MG PO CAPS
ORAL_CAPSULE | ORAL | 0 refills | Status: AC
  Filled 2023-07-10 – 2023-11-20 (×3): qty 40, 5d supply, fill #0

## 2023-07-11 ENCOUNTER — Other Ambulatory Visit (HOSPITAL_COMMUNITY): Payer: Self-pay

## 2023-07-18 ENCOUNTER — Other Ambulatory Visit (HOSPITAL_COMMUNITY): Payer: Self-pay

## 2023-07-18 MED ORDER — ROSUVASTATIN CALCIUM 5 MG PO TABS
5.0000 mg | ORAL_TABLET | Freq: Every day | ORAL | 11 refills | Status: AC
  Filled 2023-07-18: qty 30, 30d supply, fill #0
  Filled 2023-08-14: qty 30, 30d supply, fill #1
  Filled 2023-09-22: qty 30, 30d supply, fill #2
  Filled 2023-10-19: qty 30, 30d supply, fill #3
  Filled 2023-11-20: qty 30, 30d supply, fill #4
  Filled 2023-12-20 (×2): qty 30, 30d supply, fill #5
  Filled 2024-01-22: qty 30, 30d supply, fill #6
  Filled 2024-02-19: qty 30, 30d supply, fill #7

## 2023-08-14 ENCOUNTER — Other Ambulatory Visit (HOSPITAL_COMMUNITY): Payer: Self-pay

## 2023-08-15 ENCOUNTER — Other Ambulatory Visit (HOSPITAL_COMMUNITY): Payer: Self-pay

## 2023-08-17 ENCOUNTER — Ambulatory Visit: Admitting: Dermatology

## 2023-09-26 DIAGNOSIS — N3946 Mixed incontinence: Secondary | ICD-10-CM | POA: Diagnosis not present

## 2023-09-26 DIAGNOSIS — Z87442 Personal history of urinary calculi: Secondary | ICD-10-CM | POA: Diagnosis not present

## 2023-09-26 DIAGNOSIS — R3121 Asymptomatic microscopic hematuria: Secondary | ICD-10-CM | POA: Diagnosis not present

## 2023-09-29 ENCOUNTER — Other Ambulatory Visit (HOSPITAL_COMMUNITY): Payer: Self-pay | Admitting: Internal Medicine

## 2023-09-29 DIAGNOSIS — R0609 Other forms of dyspnea: Secondary | ICD-10-CM

## 2023-10-03 ENCOUNTER — Ambulatory Visit (HOSPITAL_COMMUNITY)
Admission: RE | Admit: 2023-10-03 | Discharge: 2023-10-03 | Disposition: A | Discharge status: Home / Self Care | Source: Ambulatory Visit | Attending: Internal Medicine | Admitting: Internal Medicine

## 2023-10-03 DIAGNOSIS — R0609 Other forms of dyspnea: Secondary | ICD-10-CM | POA: Diagnosis not present

## 2023-10-03 LAB — ECHOCARDIOGRAM COMPLETE
AR max vel: 2.44 cm2
AV Area VTI: 2.6 cm2
AV Area mean vel: 2.66 cm2
AV Mean grad: 3 mmHg
AV Peak grad: 5.5 mmHg
Ao pk vel: 1.17 m/s
Area-P 1/2: 4.52 cm2
Calc EF: 62.8 %
S' Lateral: 3.6 cm
Single Plane A2C EF: 65.3 %
Single Plane A4C EF: 60.8 %

## 2023-10-06 DIAGNOSIS — R3121 Asymptomatic microscopic hematuria: Secondary | ICD-10-CM | POA: Diagnosis not present

## 2023-10-13 DIAGNOSIS — R3121 Asymptomatic microscopic hematuria: Secondary | ICD-10-CM | POA: Diagnosis not present

## 2023-10-13 DIAGNOSIS — K760 Fatty (change of) liver, not elsewhere classified: Secondary | ICD-10-CM | POA: Diagnosis not present

## 2023-10-19 ENCOUNTER — Other Ambulatory Visit (HOSPITAL_COMMUNITY): Payer: Self-pay

## 2023-10-30 ENCOUNTER — Other Ambulatory Visit (HOSPITAL_COMMUNITY): Payer: Self-pay

## 2023-10-30 MED ORDER — FLUZONE HIGH-DOSE 0.5 ML IM SUSY
PREFILLED_SYRINGE | INTRAMUSCULAR | 0 refills | Status: AC
  Filled 2023-10-30: qty 0.5, 1d supply, fill #0

## 2023-11-09 ENCOUNTER — Ambulatory Visit (HOSPITAL_COMMUNITY): Payer: Self-pay | Admitting: Cardiology

## 2023-11-09 ENCOUNTER — Ambulatory Visit (HOSPITAL_COMMUNITY)
Admission: RE | Admit: 2023-11-09 | Discharge: 2023-11-09 | Disposition: A | Discharge status: Home / Self Care | Source: Ambulatory Visit | Attending: Cardiology | Admitting: Cardiology

## 2023-11-09 VITALS — BP 124/68 | HR 87 | Wt 159.2 lb

## 2023-11-09 DIAGNOSIS — R5383 Other fatigue: Secondary | ICD-10-CM | POA: Insufficient documentation

## 2023-11-09 DIAGNOSIS — Z8249 Family history of ischemic heart disease and other diseases of the circulatory system: Secondary | ICD-10-CM | POA: Insufficient documentation

## 2023-11-09 DIAGNOSIS — Z79899 Other long term (current) drug therapy: Secondary | ICD-10-CM | POA: Diagnosis not present

## 2023-11-09 DIAGNOSIS — E785 Hyperlipidemia, unspecified: Secondary | ICD-10-CM | POA: Diagnosis not present

## 2023-11-09 DIAGNOSIS — M3500 Sicca syndrome, unspecified: Secondary | ICD-10-CM | POA: Diagnosis not present

## 2023-11-09 DIAGNOSIS — Z7182 Exercise counseling: Secondary | ICD-10-CM | POA: Insufficient documentation

## 2023-11-09 DIAGNOSIS — R9431 Abnormal electrocardiogram [ECG] [EKG]: Secondary | ICD-10-CM | POA: Diagnosis not present

## 2023-11-09 DIAGNOSIS — R0609 Other forms of dyspnea: Secondary | ICD-10-CM

## 2023-11-09 LAB — BRAIN NATRIURETIC PEPTIDE: B Natriuretic Peptide: 15.6 pg/mL (ref 0.0–100.0)

## 2023-11-09 NOTE — Progress Notes (Signed)
 PCP: Shayne Anes, MD HF Cardiology: Dr. Rolan  68 y.o. with history of Sjogren's syndrome and hyperlipidemia was referred by Dr. Shayne for evaluation of dyspnea as well as family history of cardiac amyloidosis. Patient has no known cardiac history.  She had a coronary calcium  score scan about 1.5 years ago per her recollection, score was 0 Agatston units.  She has been on Crestor  for hyperlipidemia.  She has had Sjogren's syndrome since 2003.  She used to take Plaquenil but stopped this years ago.  She has a dry mouth and some joint pain, but symptoms have not been severe.   She reports becoming severely ill after the COVID vaccination about 2 years ago.  She has not felt back to normal since that time.  She says that her stamina is poor and that she always feels tired.  However, she was able to walk 3 miles on trails through the woods recently without dyspnea though she was tired afterwards.  She is able to walk 3 miles on a treadmill in 1 hour, she is tired with this but not short of breath.  No orthopnea/PND.  No palpitations.  Able to do housework, yardwork without severe limitation, just gets tired easily.  Of noted, she is not regularly exercising.  She does not smoke. She has some knee pain due to a prior partial ACL tear.   Patient's mother was diagnosed in [place] with cardiac amyloidosis.  She does not know the specific type, but probably TTR as she is not on chemotherapy and did not report association with multiple myeloma or MGUS. She is worried that she could have amyloidosis.  I reviewed the echo that was done in 9/25.  This was a normal study with EF 55-60%, no LVH, normal RV, no significant valvular disease.  She does not have symptoms of peripheral neuropathy or carpal tunnel syndrome.  No GI problems or orthostatic hypotension.   ECG (personally reviewed): NSR, nonspecific T wave flattening  Labs (6/25): K 4.3, creatinine 0.7, LDL 89  PMH: 1. Sjogren's syndrome: Currently not  being treated.  Has joint pain, dry mouth.  2. Hyperlipidemia 3. Nephrolithiasis 4. Echo (9/25): EF 55-60%, no LVH, normal RV, no significant valvular disease.   SH: Married, lives in [place].  Originally from [place]  Nonsmoker.  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   FH: Father with CAD/PCI.  Mother with CHF, diagnosed with cardiac amyloidosis in advanced age in [place]   ROS: All systems reviewed and negative except as per HPI.   Current Outpatient Medications  Medication Sig Dispense Refill   amoxicillin -clavulanate (AUGMENTIN ) 875-125 MG tablet Take 1 tablet by mouth 2 (two) times daily with a meal for 10 days 20 tablet 0   Cholecalciferol (VITAMIN D -3 PO) Take 200 Units by mouth daily.     docusate sodium (COLACE) 100 MG capsule Take 100 mg by mouth daily.     Influenza vac split trivalent PF (FLUZONE HIGH-DOSE) 0.5 ML injection Inject into the muscle. 0.5 mL 0   Probiotic Product (PROBIOTIC DAILY PO) Take 1 tablet by mouth daily.     rosuvastatin  (CRESTOR ) 5 MG tablet Take 1 tablet (5 mg total) by mouth daily. 30 tablet 11   amoxicillin -clavulanate (AUGMENTIN ) 875-125 MG tablet Take 1 tablet by mouth every 12 hours. 60 tablet 0   Ascorbic Acid (VITAMIN C) 1000 MG tablet Take 1,000 mg by mouth daily.     molnupiravir  EUA (LAGEVRIO ) 200 MG CAPS capsule Take 4 capsules  by mouth every 12 hrs for 5 days (Patient not taking: Reported on 11/09/2023) 40 capsule 0   No current facility-administered medications for this encounter.   BP 124/68   Pulse 87   Wt 72.2 kg (159 lb 3.2 oz)   LMP 01/31/1994   SpO2 97%   BMI 29.59 kg/m  General: NAD Neck: No JVD, no thyromegaly or thyroid  nodule.  Lungs: Clear to auscultation bilaterally with normal respiratory effort. CV: Nondisplaced PMI.  Heart regular S1/S2, no S3/S4, no murmur.  No peripheral edema.  No carotid bruit.  Normal pedal pulses.  Abdomen: Soft, nontender, no hepatosplenomegaly, no distention.  Skin: Intact  without lesions or rashes.  Neurologic: Alert and oriented x 3.  Psych: Normal affect. Extremities: No clubbing or cyanosis.  HEENT: Normal.   Assessment/Plan: 1. Family history of cardiac amyloidosis: Patient's mother was diagnosed with cardiac amyloidosis in [place]  She does not know the details of the diagnosis, but does not think she was found to have MGUS or multiple myeloma.  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  Her mother is in her 24s, so suspect most likely wild type.  Patient's echo from 9/25 was reviewed and not suggestive of cardiac amyloidosis.  She does not have extracardiac symptoms concerning for cardiac amyloidosis.  - I will send genetic testing for hereditary transthyretin cardiac amyloidosis, this can be done free of charge.  If this is negative, I suspect that her risk is low.  2.  Fatigue: Patient reports prominent fatigue though she is able to walk 3-4 miles at a time and does not seem particularly limited.  She is not volume overloaded on exam and has no chest pain.  - Check BNP.  - I will arrange for CPX.  This will allow us  to differentiate between cardiac problems, pulmonary problems, and deconditioning.  Deconditioning may be the most likely diagnosis as she does not regularly exercise.  - I asked her to start slowly on a regular exercise program.  3.  CAD risk: Patient reports a calcium  score of 0 Agatston units done about 1.5 years ago.  She start Crestor  5 mg daily in June.   I will have her followup in about 3 months to see how she is doing.   I spent 56 minutes reviewing records, interviewing/examining patient, and managing orders.   Ezra Shuck 11/09/2023

## 2023-11-09 NOTE — Patient Instructions (Addendum)
 There has been no changes to your medications.  Labs done today, your results will be available in MyChart, we will contact you for abnormal readings.  Genetic testing has been collected, this has to be sent to [place]  for processing and can take 1-2 weeks for us  to get results back.  We will let you know the results once reviewed by your provider.  You are scheduled for a Cardiopulmonary Exercise (CPX) Test as [facility] on: Date:  11/20/2023    Time:  11.00 AM   Expect to be in the lab for 2 hours. Please plan to arrive 30 minutes prior to your appointment. You may be asked to reschedule your test if you arrive 20 minutes or more after your scheduled appointment time.  Main Campus address: [address] [place], [place] [address] You may arrive to the Main Entrance A or Entrance C (free valet parking is available at both). -Main Entrance A (on [address]) :proceed to admitting for check in -Entrance C (on [facility]): proceed to [employer] parking or under hospital deck parking using this code _________  Check In: [facility] waiting room (1st floor)   General Instructions for the day of the test (Please follow all instructions from your physician): Refrain from ingesting a heavy meal, alcohol, or caffeine or using tobacco products within 2 hours of the test (DO NOT FAST for mare than 8 hours). You may have all other non-alcoholic, non -caffeinated beverage,a light snack (crackers,a piece of fruit, carrot sticks, toast bagel,etc) up to your appointment. Avoid significant exertion or exercise within 24 hours of your test. Be prepared to exercise and sweat. Your clothing should permit freedom of movement and include walking or running shoes. Women bring loose fitting short sleeved blouse.  This evaluation may be fatiguing and you may wish ti have someone accompany you to the assessment to drive you home afterward. Bring a list of your medications with you, including  dosage and frequency you take the medications (  I.e.,once per day, twice per day, etc). Take all medications as prescribed, unless noted below or instructed to do so by your physician.  Please do not take the following medications prior to your CPX:  _________________________________________________  _________________________________________________  Brief description of the test: A brief lung test will be performed. This will involve you taking deep breaths and blowing hard and fast through your mouth. During these , a clip will be on your nose and you will be breathing through a breathing device.   For the exercise portion of the test you will be walking on a treadmill, or riding a stationary bike, to your maximal effor or until symptoms such as chest pain, shortness of breath, leg pain or dizziness limit your exercise. You will be breathing in and out of a breathing device through your mouth (a clip will be on your nose again). Your heart rate, ECG, blood pressure, oxygen saturations, breathing rate and depth, amount of oxygen you consume and amount of carbon dioxide you produce will be measured and monitored throughout the exercise test.  If you need to cancel or reschedule your appointment please call [phone] If you have further questions please call your physician or Damien Nunnery at [phone]  PLEASE USE YOUR GYM MEMBERSHIP AT LEAST 3-4 TIMES A WEEK.  Your physician recommends that you schedule a follow-up appointment in: 3 MONTHS ( January 2026) ** PLEASE CALL THE OFFICE IN DECEMBER TO ARRANGE YOUR FOLLOW UP APPOINTMENT.**  If  you have any questions or concerns before your next appointment please send us  a message through [employer] or call our office at [phone].    TO LEAVE A MESSAGE FOR THE NURSE SELECT OPTION 2, PLEASE LEAVE A MESSAGE INCLUDING: YOUR NAME DATE OF BIRTH CALL BACK NUMBER REASON FOR CALL**this is important as we prioritize the call backs  YOU WILL RECEIVE  A CALL BACK THE SAME DAY AS LONG AS YOU CALL BEFORE 4:00 PM  At the [facility], you and your health needs are our priority. As part of our continuing mission to provide you with exceptional heart care, we have created designated Provider Care Teams. These Care Teams include your primary Cardiologist (physician) and Advanced Practice Providers (APPs- Physician Assistants and Nurse Practitioners) who all work together to provide you with the care you need, when you need it.   You may see any of the following providers on your designated Care Team at your next follow up: Dr Toribio Fuel Dr Ezra Shuck Dr. Ria Commander Dr. Morene Brownie Amy Lenetta, NP Caffie Shed, [place] [facility] [place], [place] Beckey Coe, NP [place] Lee, NP Ellouise Class, NP Tinnie Redman, PharmD Jaun Bash, PharmD   Please be sure to bring in all your medications bottles to every appointment.    Thank you for choosing [facility]

## 2023-11-20 ENCOUNTER — Ambulatory Visit (HOSPITAL_COMMUNITY): Attending: Cardiology

## 2023-11-20 ENCOUNTER — Other Ambulatory Visit (HOSPITAL_COMMUNITY): Payer: Self-pay

## 2023-11-20 ENCOUNTER — Other Ambulatory Visit: Payer: Self-pay

## 2023-11-20 DIAGNOSIS — R0609 Other forms of dyspnea: Secondary | ICD-10-CM | POA: Diagnosis not present

## 2023-11-21 ENCOUNTER — Other Ambulatory Visit: Payer: Self-pay | Admitting: Internal Medicine

## 2023-11-21 DIAGNOSIS — R3121 Asymptomatic microscopic hematuria: Secondary | ICD-10-CM | POA: Diagnosis not present

## 2023-11-21 DIAGNOSIS — N3281 Overactive bladder: Secondary | ICD-10-CM | POA: Diagnosis not present

## 2023-11-21 DIAGNOSIS — R350 Frequency of micturition: Secondary | ICD-10-CM | POA: Diagnosis not present

## 2023-11-21 DIAGNOSIS — Z1231 Encounter for screening mammogram for malignant neoplasm of breast: Secondary | ICD-10-CM

## 2023-11-21 DIAGNOSIS — R0600 Dyspnea, unspecified: Secondary | ICD-10-CM | POA: Diagnosis not present

## 2023-11-21 DIAGNOSIS — R3915 Urgency of urination: Secondary | ICD-10-CM | POA: Diagnosis not present

## 2023-11-30 ENCOUNTER — Other Ambulatory Visit: Payer: Self-pay | Admitting: Medical Genetics

## 2023-12-06 ENCOUNTER — Ambulatory Visit
Admission: RE | Admit: 2023-12-06 | Discharge: 2023-12-06 | Disposition: A | Source: Ambulatory Visit | Attending: Internal Medicine | Admitting: Internal Medicine

## 2023-12-06 DIAGNOSIS — Z1231 Encounter for screening mammogram for malignant neoplasm of breast: Secondary | ICD-10-CM

## 2023-12-06 NOTE — Telephone Encounter (Addendum)
 Pt aware, agreeable, and verbalized understanding  Patient also made aware of her genetic testing results.  ----- Message from Ezra Shuck sent at 12/04/2023  7:37 AM EST ----- No significant cardiopulmonary abnormality.  ----- Message ----- From: Star Damien BRAVO Sent: 11/21/2023   2:21 PM EST To: Ezra GORMAN Shuck, MD

## 2023-12-08 ENCOUNTER — Other Ambulatory Visit (HOSPITAL_COMMUNITY): Payer: Self-pay | Admitting: Cardiology

## 2023-12-20 ENCOUNTER — Other Ambulatory Visit (HOSPITAL_COMMUNITY): Payer: Self-pay

## 2023-12-20 MED ORDER — ERYTHROMYCIN 5 MG/GM OP OINT
1.0000 | TOPICAL_OINTMENT | Freq: Four times a day (QID) | OPHTHALMIC | 1 refills | Status: AC
  Filled 2023-12-20: qty 3.5, 22d supply, fill #0

## 2024-01-01 ENCOUNTER — Other Ambulatory Visit (HOSPITAL_COMMUNITY): Payer: Self-pay

## 2024-01-01 MED ORDER — PREDNISONE 5 MG (21) PO TBPK
ORAL_TABLET | ORAL | 0 refills | Status: AC
  Filled 2024-01-01: qty 21, 6d supply, fill #0

## 2024-02-15 ENCOUNTER — Other Ambulatory Visit (HOSPITAL_COMMUNITY)

## 2024-02-19 ENCOUNTER — Other Ambulatory Visit (HOSPITAL_COMMUNITY): Payer: Self-pay

## 2024-02-29 ENCOUNTER — Other Ambulatory Visit (HOSPITAL_COMMUNITY): Payer: Self-pay

## 2024-02-29 MED ORDER — AMOXICILLIN-POT CLAVULANATE 500-125 MG PO TABS
1.0000 | ORAL_TABLET | Freq: Three times a day (TID) | ORAL | 0 refills | Status: AC
  Filled 2024-02-29: qty 21, 7d supply, fill #0

## 2024-03-19 ENCOUNTER — Other Ambulatory Visit (HOSPITAL_COMMUNITY)
